# Patient Record
Sex: Female | Born: 1935 | Race: White | Hispanic: No | Smoking: Former smoker
Health system: Southern US, Community
[De-identification: ages and names within clinical notes are randomized; demographics above are authoritative.]

## PROBLEM LIST (undated history)

## (undated) DIAGNOSIS — I10 Essential (primary) hypertension: Secondary | ICD-10-CM

## (undated) DIAGNOSIS — E785 Hyperlipidemia, unspecified: Secondary | ICD-10-CM

## (undated) DIAGNOSIS — M353 Polymyalgia rheumatica: Secondary | ICD-10-CM

## (undated) DIAGNOSIS — C801 Malignant (primary) neoplasm, unspecified: Secondary | ICD-10-CM

## (undated) DIAGNOSIS — K219 Gastro-esophageal reflux disease without esophagitis: Secondary | ICD-10-CM

## (undated) DIAGNOSIS — M1990 Unspecified osteoarthritis, unspecified site: Secondary | ICD-10-CM

## (undated) DIAGNOSIS — M519 Unspecified thoracic, thoracolumbar and lumbosacral intervertebral disc disorder: Secondary | ICD-10-CM

## (undated) HISTORY — PX: ABDOMINAL HYSTERECTOMY: SHX81

---

## 2006-06-01 ENCOUNTER — Ambulatory Visit: Payer: Self-pay | Admitting: Unknown Physician Specialty

## 2008-06-22 ENCOUNTER — Inpatient Hospital Stay: Payer: Self-pay | Admitting: Internal Medicine

## 2008-07-11 ENCOUNTER — Ambulatory Visit: Payer: Self-pay | Admitting: Gastroenterology

## 2009-08-08 ENCOUNTER — Ambulatory Visit: Payer: Self-pay | Admitting: Family Medicine

## 2009-08-22 ENCOUNTER — Ambulatory Visit: Payer: Self-pay | Admitting: Family Medicine

## 2011-02-19 ENCOUNTER — Ambulatory Visit: Payer: Self-pay | Admitting: Family Medicine

## 2012-05-01 ENCOUNTER — Ambulatory Visit: Payer: Self-pay | Admitting: Family Medicine

## 2012-07-10 ENCOUNTER — Ambulatory Visit: Payer: Self-pay | Admitting: Gastroenterology

## 2012-07-21 ENCOUNTER — Ambulatory Visit: Payer: Self-pay | Admitting: Gastroenterology

## 2012-12-06 ENCOUNTER — Ambulatory Visit: Payer: Self-pay | Admitting: Family Medicine

## 2013-01-08 ENCOUNTER — Ambulatory Visit: Payer: Self-pay | Admitting: Family Medicine

## 2013-02-06 ENCOUNTER — Encounter: Payer: Self-pay | Admitting: Family Medicine

## 2013-02-10 ENCOUNTER — Encounter: Payer: Self-pay | Admitting: Family Medicine

## 2013-03-12 ENCOUNTER — Encounter: Payer: Self-pay | Admitting: Family Medicine

## 2014-01-07 ENCOUNTER — Ambulatory Visit: Payer: Self-pay | Admitting: Family Medicine

## 2014-06-26 DIAGNOSIS — R202 Paresthesia of skin: Secondary | ICD-10-CM | POA: Insufficient documentation

## 2014-06-26 DIAGNOSIS — R29898 Other symptoms and signs involving the musculoskeletal system: Secondary | ICD-10-CM | POA: Insufficient documentation

## 2014-06-26 DIAGNOSIS — M5431 Sciatica, right side: Secondary | ICD-10-CM | POA: Insufficient documentation

## 2014-06-26 DIAGNOSIS — R200 Anesthesia of skin: Secondary | ICD-10-CM | POA: Insufficient documentation

## 2014-08-28 DIAGNOSIS — I10 Essential (primary) hypertension: Secondary | ICD-10-CM | POA: Insufficient documentation

## 2014-08-28 DIAGNOSIS — E7800 Pure hypercholesterolemia, unspecified: Secondary | ICD-10-CM | POA: Insufficient documentation

## 2015-03-28 ENCOUNTER — Other Ambulatory Visit: Payer: Self-pay | Admitting: Gastroenterology

## 2015-03-28 DIAGNOSIS — R1031 Right lower quadrant pain: Secondary | ICD-10-CM

## 2015-03-28 DIAGNOSIS — R1032 Left lower quadrant pain: Secondary | ICD-10-CM

## 2015-04-08 ENCOUNTER — Ambulatory Visit
Admission: RE | Admit: 2015-04-08 | Discharge: 2015-04-08 | Disposition: A | Discharge status: Home / Self Care | Payer: 59 | Source: Ambulatory Visit | Attending: Gastroenterology | Admitting: Gastroenterology

## 2015-04-08 DIAGNOSIS — K5790 Diverticulosis of intestine, part unspecified, without perforation or abscess without bleeding: Secondary | ICD-10-CM | POA: Diagnosis not present

## 2015-04-08 DIAGNOSIS — R1032 Left lower quadrant pain: Secondary | ICD-10-CM | POA: Insufficient documentation

## 2015-04-08 DIAGNOSIS — R1031 Right lower quadrant pain: Secondary | ICD-10-CM | POA: Diagnosis present

## 2015-04-08 DIAGNOSIS — I700 Atherosclerosis of aorta: Secondary | ICD-10-CM | POA: Diagnosis not present

## 2015-04-08 HISTORY — DX: Essential (primary) hypertension: I10

## 2015-04-08 MED ORDER — IOHEXOL 300 MG/ML  SOLN
100.0000 mL | Freq: Once | INTRAMUSCULAR | Status: AC | PRN
  Administered 2015-04-08: 100 mL via INTRAVENOUS

## 2015-08-29 DIAGNOSIS — K219 Gastro-esophageal reflux disease without esophagitis: Secondary | ICD-10-CM | POA: Insufficient documentation

## 2016-03-03 DIAGNOSIS — Z0000 Encounter for general adult medical examination without abnormal findings: Secondary | ICD-10-CM | POA: Insufficient documentation

## 2016-11-16 IMAGING — CT CT ABD-PELV W/ CM
2 of 5 series · 16 of 46 positions shown, 18 images · IV contrast (omnipaque)
Comparison: None.

CLINICAL DATA: Abdominal pain and persistent diarrhea for 3 months.

EXAM:
CT ABDOMEN AND PELVIS WITH CONTRAST
TECHNIQUE: Multidetector CT imaging of the abdomen and pelvis was performed
using the standard protocol following bolus administration of
intravenous contrast.
CONTRAST:  100mL OMNIPAQUE IOHEXOL 300 MG/ML  SOLN

[Series 2: routine with · axial · 0.63mm/px · z∈[-859,-479]mm · 13 of 86 slices shown, 15 images]
[im 5/86  soft-tissue]
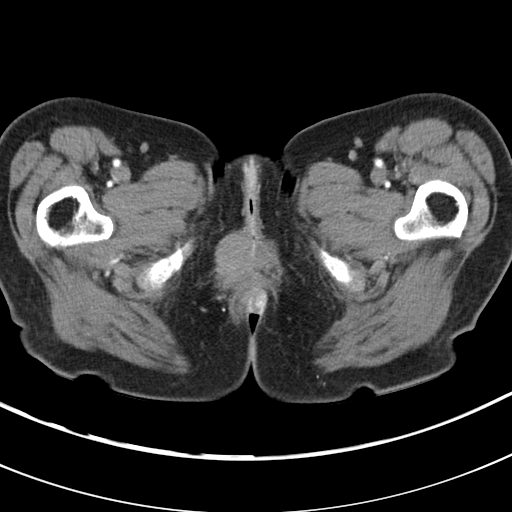
[im 5/86  bone]
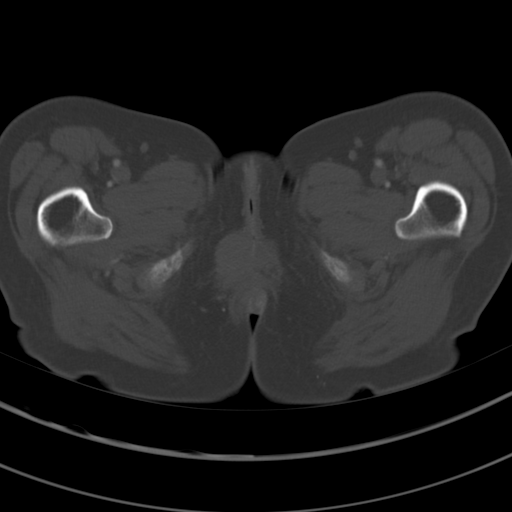
[im 10/86  soft-tissue]
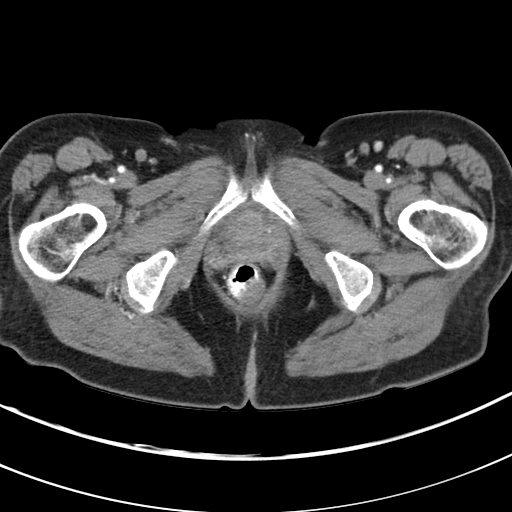
[im 19/86  soft-tissue]
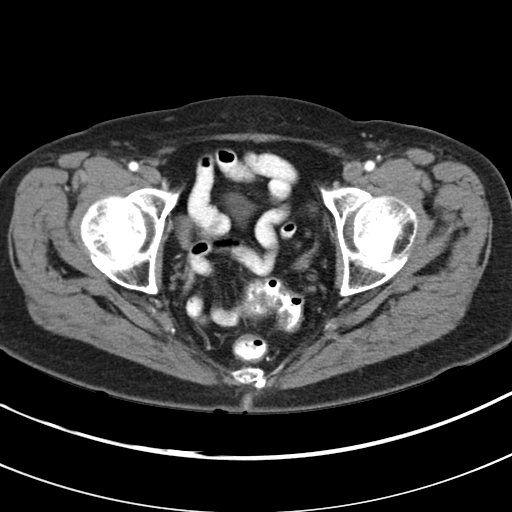
[im 24/86  soft-tissue]
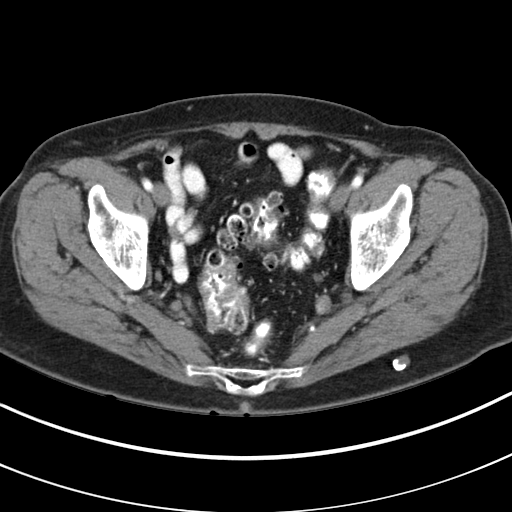
[im 29/86  soft-tissue]
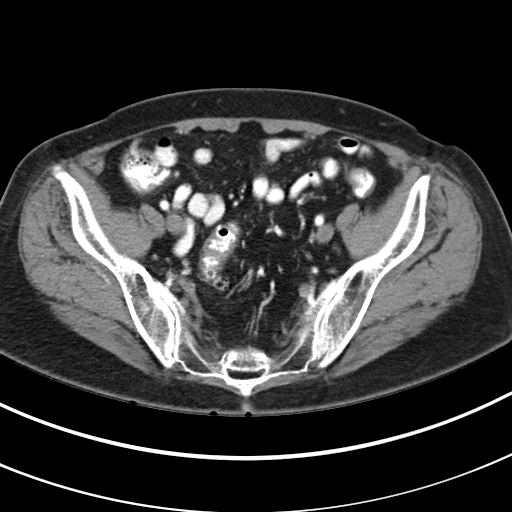
[im 38/86  soft-tissue]
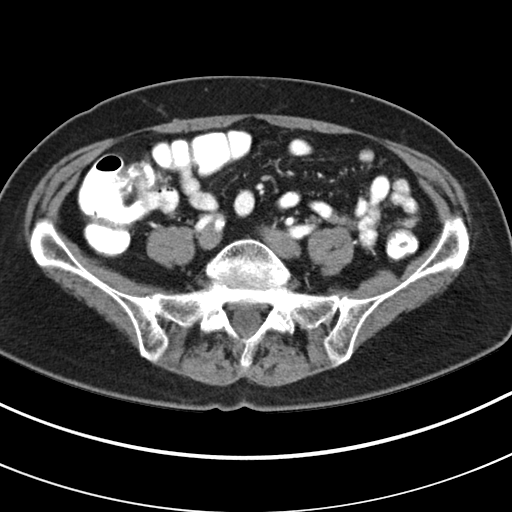
[im 43/86  soft-tissue]
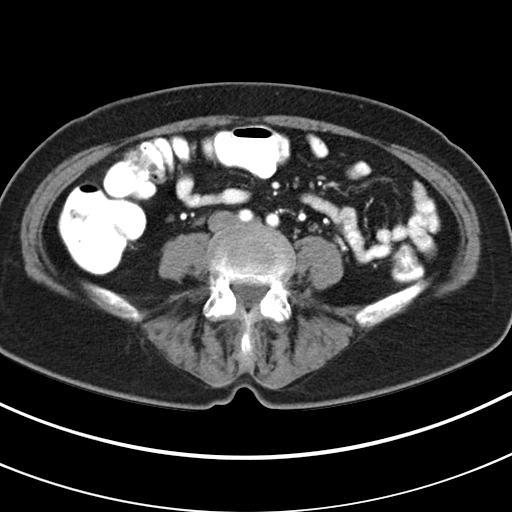
[im 48/86  soft-tissue]
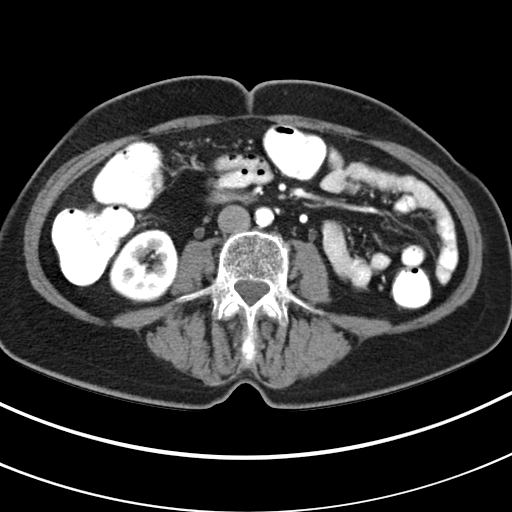
[im 57/86  soft-tissue]
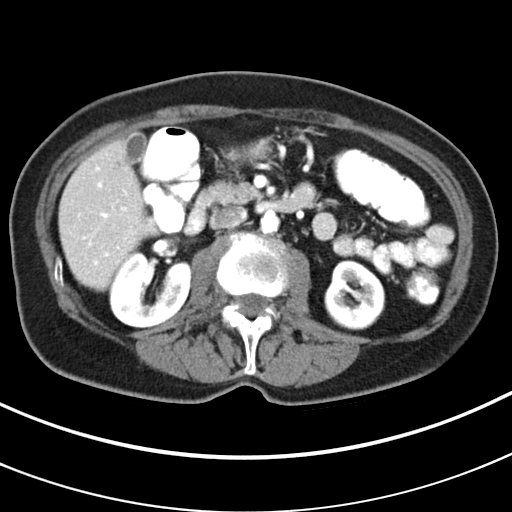
[im 57/86  bone]
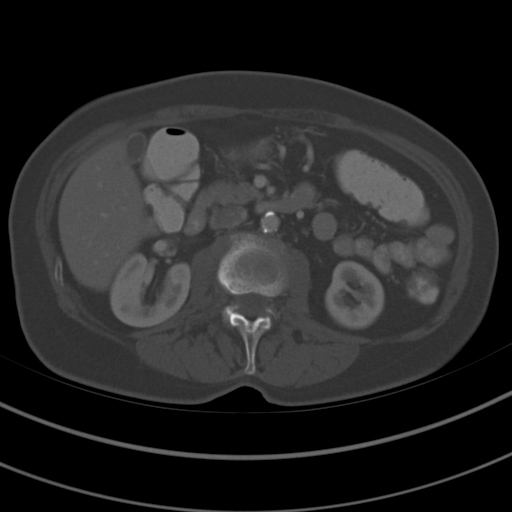
[im 62/86  soft-tissue]
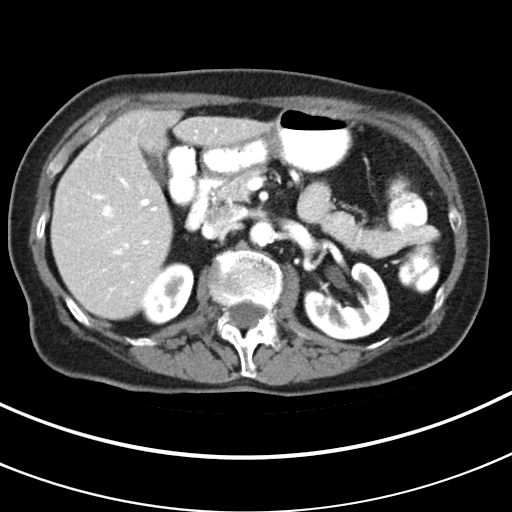
[im 67/86  soft-tissue]
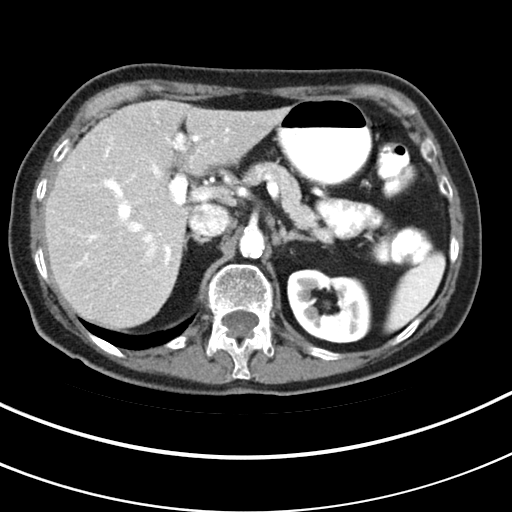
[im 76/86  soft-tissue]
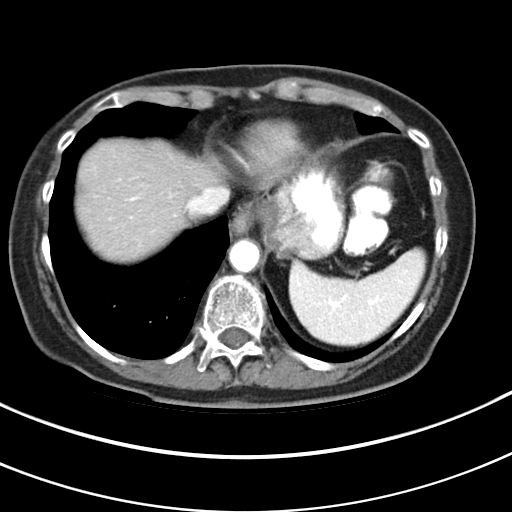
[im 81/86  soft-tissue]
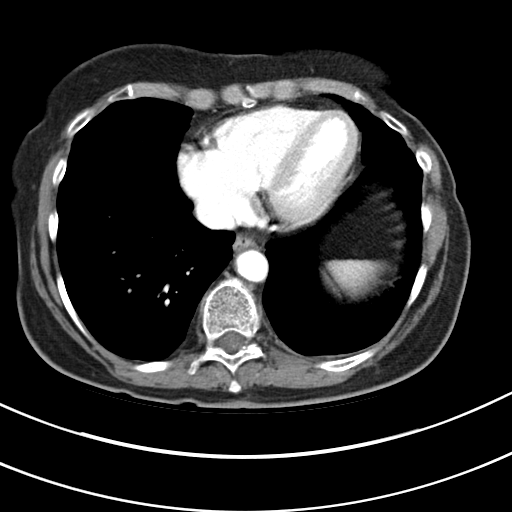

[Series 5: cor routine with · coronal · 0.68mm/px · 3 of 125 slices shown]
[im 42/125  soft-tissue]
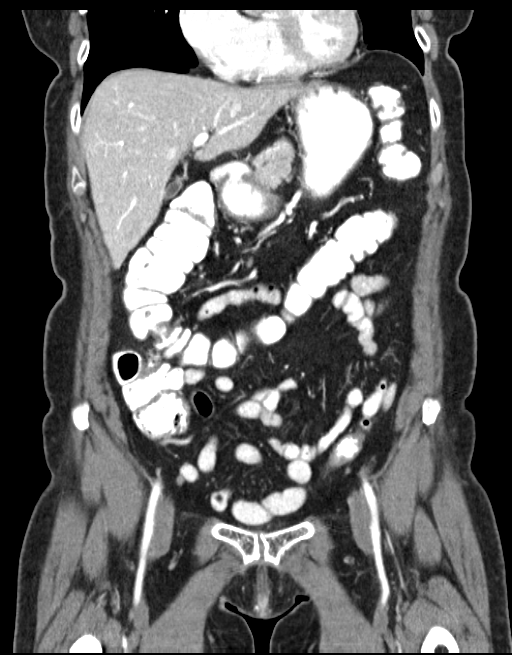
[im 56/125  soft-tissue]
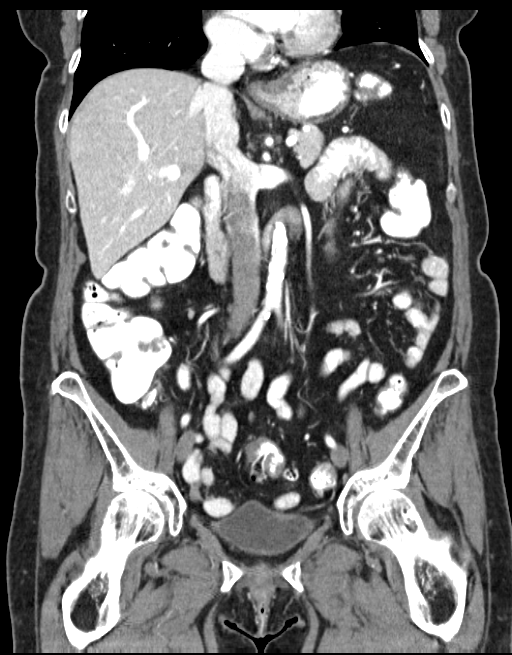
[im 69/125  soft-tissue]
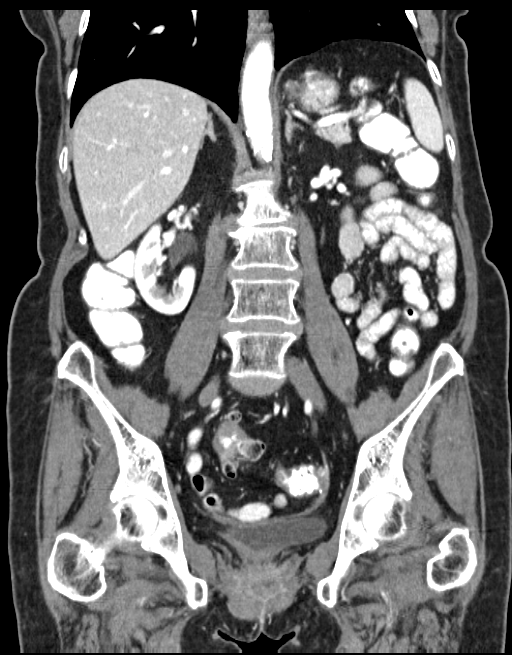

[16 of 46 positions shown; findings below may reference images not displayed]

FINDINGS: Lower chest:  Normal.

Hepatobiliary: Normal.

Pancreas: Normal.

Spleen: Normal.

Adrenals/Urinary Tract: Normal.

Stomach/Bowel: Numerous diverticula in the distal sigmoid colon. The
bowel is otherwise normal including the terminal ileum and appendix.

Vascular/Lymphatic: Extensive aortic atherosclerosis. No adenopathy.

Reproductive: Uterus has been removed.  Ovaries are normal.

Other: No free air or free fluid.

Musculoskeletal: Moderate degenerative disc disease at L2-3. No
acute osseous abnormality. Schmorl's nodes in the inferior and
superior endplates T11.
IMPRESSION: Diverticulosis of the distal colon. Aortic atherosclerosis. No acute
abnormalities.

## 2017-03-23 DIAGNOSIS — I700 Atherosclerosis of aorta: Secondary | ICD-10-CM | POA: Insufficient documentation

## 2017-08-16 ENCOUNTER — Other Ambulatory Visit: Payer: Self-pay | Admitting: Gastroenterology

## 2017-08-16 DIAGNOSIS — R1319 Other dysphagia: Secondary | ICD-10-CM

## 2017-08-16 DIAGNOSIS — R1310 Dysphagia, unspecified: Secondary | ICD-10-CM

## 2017-08-16 DIAGNOSIS — K529 Noninfective gastroenteritis and colitis, unspecified: Secondary | ICD-10-CM

## 2017-08-22 ENCOUNTER — Ambulatory Visit
Admission: RE | Admit: 2017-08-22 | Discharge: 2017-08-22 | Disposition: A | Discharge status: Home / Self Care | Payer: Medicare Other | Source: Ambulatory Visit | Attending: Gastroenterology | Admitting: Gastroenterology

## 2017-08-22 DIAGNOSIS — R1310 Dysphagia, unspecified: Secondary | ICD-10-CM | POA: Diagnosis present

## 2017-08-22 DIAGNOSIS — R1319 Other dysphagia: Secondary | ICD-10-CM

## 2017-08-22 DIAGNOSIS — K219 Gastro-esophageal reflux disease without esophagitis: Secondary | ICD-10-CM | POA: Diagnosis not present

## 2017-08-22 DIAGNOSIS — K529 Noninfective gastroenteritis and colitis, unspecified: Secondary | ICD-10-CM

## 2017-09-02 ENCOUNTER — Encounter: Payer: Self-pay | Admitting: *Deleted

## 2017-09-06 ENCOUNTER — Ambulatory Visit: Payer: Medicare Other | Admitting: Anesthesiology

## 2017-09-06 ENCOUNTER — Ambulatory Visit
Admission: RE | Admit: 2017-09-06 | Discharge: 2017-09-06 | Disposition: A | Discharge status: Home / Self Care | Payer: Medicare Other | Source: Ambulatory Visit | Attending: Gastroenterology | Admitting: Gastroenterology

## 2017-09-06 ENCOUNTER — Encounter: Payer: Self-pay | Admitting: *Deleted

## 2017-09-06 ENCOUNTER — Encounter
Admission: RE | Disposition: A | Discharge status: Home / Self Care | Payer: Self-pay | Source: Ambulatory Visit | Attending: Gastroenterology

## 2017-09-06 DIAGNOSIS — M353 Polymyalgia rheumatica: Secondary | ICD-10-CM | POA: Insufficient documentation

## 2017-09-06 DIAGNOSIS — E785 Hyperlipidemia, unspecified: Secondary | ICD-10-CM | POA: Diagnosis not present

## 2017-09-06 DIAGNOSIS — K529 Noninfective gastroenteritis and colitis, unspecified: Secondary | ICD-10-CM | POA: Diagnosis not present

## 2017-09-06 DIAGNOSIS — K219 Gastro-esophageal reflux disease without esophagitis: Secondary | ICD-10-CM | POA: Insufficient documentation

## 2017-09-06 DIAGNOSIS — R1310 Dysphagia, unspecified: Secondary | ICD-10-CM | POA: Diagnosis not present

## 2017-09-06 DIAGNOSIS — Q438 Other specified congenital malformations of intestine: Secondary | ICD-10-CM | POA: Diagnosis not present

## 2017-09-06 DIAGNOSIS — R194 Change in bowel habit: Secondary | ICD-10-CM | POA: Insufficient documentation

## 2017-09-06 DIAGNOSIS — Z7982 Long term (current) use of aspirin: Secondary | ICD-10-CM | POA: Diagnosis not present

## 2017-09-06 DIAGNOSIS — K5790 Diverticulosis of intestine, part unspecified, without perforation or abscess without bleeding: Secondary | ICD-10-CM | POA: Insufficient documentation

## 2017-09-06 DIAGNOSIS — I10 Essential (primary) hypertension: Secondary | ICD-10-CM | POA: Diagnosis not present

## 2017-09-06 DIAGNOSIS — R1013 Epigastric pain: Secondary | ICD-10-CM | POA: Insufficient documentation

## 2017-09-06 DIAGNOSIS — Z86010 Personal history of colonic polyps: Secondary | ICD-10-CM | POA: Diagnosis not present

## 2017-09-06 DIAGNOSIS — Z85828 Personal history of other malignant neoplasm of skin: Secondary | ICD-10-CM | POA: Diagnosis not present

## 2017-09-06 DIAGNOSIS — Z79899 Other long term (current) drug therapy: Secondary | ICD-10-CM | POA: Insufficient documentation

## 2017-09-06 DIAGNOSIS — K224 Dyskinesia of esophagus: Secondary | ICD-10-CM | POA: Diagnosis not present

## 2017-09-06 DIAGNOSIS — K449 Diaphragmatic hernia without obstruction or gangrene: Secondary | ICD-10-CM | POA: Insufficient documentation

## 2017-09-06 DIAGNOSIS — Z87891 Personal history of nicotine dependence: Secondary | ICD-10-CM | POA: Diagnosis not present

## 2017-09-06 DIAGNOSIS — K2950 Unspecified chronic gastritis without bleeding: Secondary | ICD-10-CM | POA: Diagnosis not present

## 2017-09-06 HISTORY — DX: Gastro-esophageal reflux disease without esophagitis: K21.9

## 2017-09-06 HISTORY — DX: Unspecified osteoarthritis, unspecified site: M19.90

## 2017-09-06 HISTORY — DX: Malignant (primary) neoplasm, unspecified: C80.1

## 2017-09-06 HISTORY — PX: COLONOSCOPY WITH PROPOFOL: SHX5780

## 2017-09-06 HISTORY — DX: Unspecified thoracic, thoracolumbar and lumbosacral intervertebral disc disorder: M51.9

## 2017-09-06 HISTORY — PX: ESOPHAGOGASTRODUODENOSCOPY (EGD) WITH PROPOFOL: SHX5813

## 2017-09-06 HISTORY — DX: Hyperlipidemia, unspecified: E78.5

## 2017-09-06 HISTORY — DX: Polymyalgia rheumatica: M35.3

## 2017-09-06 SURGERY — COLONOSCOPY WITH PROPOFOL
Anesthesia: General

## 2017-09-06 MED ORDER — PROPOFOL 10 MG/ML IV BOLUS
INTRAVENOUS | Status: AC
  Filled 2017-09-06: qty 40

## 2017-09-06 MED ORDER — FENTANYL CITRATE (PF) 100 MCG/2ML IJ SOLN
INTRAMUSCULAR | Status: AC
  Filled 2017-09-06: qty 2

## 2017-09-06 MED ORDER — SODIUM CHLORIDE 0.9 % IV SOLN
INTRAVENOUS | Status: DC
  Administered 2017-09-06: 1000 mL via INTRAVENOUS

## 2017-09-06 MED ORDER — PROPOFOL 500 MG/50ML IV EMUL
INTRAVENOUS | Status: DC | PRN
  Administered 2017-09-06: 125 ug/kg/min via INTRAVENOUS

## 2017-09-06 MED ORDER — PROPOFOL 10 MG/ML IV BOLUS
INTRAVENOUS | Status: DC | PRN
  Administered 2017-09-06: 80 mg via INTRAVENOUS

## 2017-09-06 MED ORDER — LIDOCAINE HCL (CARDIAC) PF 100 MG/5ML IV SOSY
PREFILLED_SYRINGE | INTRAVENOUS | Status: DC | PRN
  Administered 2017-09-06: 80 mg via INTRAVENOUS

## 2017-09-06 MED ORDER — FENTANYL CITRATE (PF) 100 MCG/2ML IJ SOLN
INTRAMUSCULAR | Status: DC | PRN
  Administered 2017-09-06: 50 ug via INTRAVENOUS
  Administered 2017-09-06 (×2): 25 ug via INTRAVENOUS

## 2017-09-06 MED ORDER — SODIUM CHLORIDE 0.9 % IV SOLN: INTRAVENOUS | Status: DC

## 2017-09-06 NOTE — Anesthesia Post-op Follow-up Note (Signed)
Anesthesia QCDR form completed.        

## 2017-09-06 NOTE — Transfer of Care (Signed)
Immediate Anesthesia Transfer of Care Note  Patient: Caroline Campbell  Procedure(s) Performed: COLONOSCOPY WITH PROPOFOL (N/A ) ESOPHAGOGASTRODUODENOSCOPY (EGD) WITH PROPOFOL (N/A )  Patient Location: PACU  Anesthesia Type:General  Level of Consciousness: sedated  Airway & Oxygen Therapy: Patient Spontanous Breathing  Post-op Assessment: Report given to RN and Post -op Vital signs reviewed and stable  Post vital signs: Reviewed and stable  Last Vitals:  Vitals Value Taken Time  BP    Temp    Pulse    Resp    SpO2      Last Pain: There were no vitals filed for this visit.       Complications: No apparent anesthesia complications

## 2017-09-06 NOTE — Op Note (Signed)
[facility] Patient Name: Caroline Campbell Procedure Date: 09/06/2017 12:19 PM MRN: [id number] Account #: [id number] Date of Birth: 07-07-1935 Admit Type: Outpatient Age: 82 Room: [facility] ENDO ROOM 2 Gender: Female Note Status: Finalized Procedure:            Upper GI endoscopy Indications:          Dyspepsia, Dysphagia, Gastro-esophageal reflux disease Providers:            Lollie Sails, MD Referring MD:         Ocie Cornfield. Ouida Sills MD, MD (Referring MD) Medicines:            Monitored Anesthesia Care Complications:        No immediate complications. Procedure:            Pre-Anesthesia Assessment:                       - ASA Grade Assessment: II - A patient with mild                        systemic disease.                       After obtaining informed consent, the endoscope was                        passed under direct vision. Throughout the procedure,                        the patient's blood pressure, pulse, and oxygen                        saturations were monitored continuously. The Endoscope                        was introduced through the mouth, and advanced to the                        third part of duodenum. The upper GI endoscopy was                        accomplished without difficulty. The patient tolerated                        the procedure well. Findings:      Abnormal motility was noted in the middle third of the esophagus and in       the lower third of the esophagus. The cricopharyngeus was normal. There       are extra peristaltic waves in the esophageal body. The distal       esophagus/lower esophageal sphincter is open. Tertiary peristaltic waves       are noted.      The Z-line was regular. Biopsies were taken with a cold forceps for       histology.      A small hiatal hernia was found. The Z-line was a variable distance from       incisors; the hiatal hernia was sliding.      Localized inflammation characterized  by congestion (edema) and erythema       was found in the gastric body and on the greater curvature of the  gastric body. Biopsies were taken with a cold forceps for histology.       Biopsies were taken with a cold forceps for Helicobacter pylori testing.      The cardia and gastric fundus were normal on retroflexion otherwise.      Diffuse mild mucosal variance characterized by smoothness and altered       texture was found in the entire duodenum. Biopsies were taken with a       cold forceps for histology. Impression:           - Abnormal esophageal motility, suspicious for                        presbyesophagus.                       - Z-line regular. Biopsied.                       - Small hiatal hernia. Recommendation:       - Continue present medications.                       - Return to GI clinic in 6 weeks.                       - Await pathology results.                       - Telephone GI clinic for pathology results in 1 week. Procedure Code(s):    --- Professional ---                       [phone], Esophagogastroduodenoscopy, flexible, transoral;                        with biopsy, single or multiple Diagnosis Code(s):    --- Professional ---                       K22.4, Dyskinesia of esophagus                       K44.9, Diaphragmatic hernia without obstruction or                        gangrene                       R10.13, Epigastric pain                       R13.10, Dysphagia, unspecified                       K21.9, Gastro-esophageal reflux disease without                        esophagitis CPT copyright 2017 American Medical Association. All rights reserved. The codes documented in this report are preliminary and upon coder review may  be revised to meet current compliance requirements. Lollie Sails, MD 09/06/2017 1:01:35 PM This report has been signed electronically. Number of Addenda: 0 Note Initiated On: 09/06/2017 12:19 PM      [facility]

## 2017-09-06 NOTE — Anesthesia Preprocedure Evaluation (Addendum)
Anesthesia Evaluation  Patient identified by MRN, date of birth, ID band Patient awake    Reviewed: Allergy & Precautions, NPO status , Patient's Chart, lab work & pertinent test results, reviewed documented beta blocker date and time   Airway Mallampati: II  TM Distance: >3 FB     Dental  (+) Upper Dentures, Lower Dentures   Pulmonary former smoker,           Cardiovascular hypertension, Pt. on medications and Pt. on home beta blockers      Neuro/Psych    GI/Hepatic GERD  ,  Endo/Other    Renal/GU      Musculoskeletal  (+) Arthritis ,   Abdominal   Peds  Hematology   Anesthesia Other Findings   Reproductive/Obstetrics                            Anesthesia Physical Anesthesia Plan  ASA: III  Anesthesia Plan: General   Post-op Pain Management:    Induction: Intravenous  PONV Risk Score and Plan:   Airway Management Planned:   Additional Equipment:   Intra-op Plan:   Post-operative Plan:   Informed Consent: I have reviewed the patients History and Physical, chart, labs and discussed the procedure including the risks, benefits and alternatives for the proposed anesthesia with the patient or authorized representative who has indicated his/her understanding and acceptance.     Plan Discussed with: CRNA  Anesthesia Plan Comments:         Anesthesia Quick Evaluation

## 2017-09-06 NOTE — Anesthesia Postprocedure Evaluation (Signed)
Anesthesia Post Note  Patient: Caroline Campbell  Procedure(s) Performed: COLONOSCOPY WITH PROPOFOL (N/A ) ESOPHAGOGASTRODUODENOSCOPY (EGD) WITH PROPOFOL (N/A )  Patient location during evaluation: Endoscopy Anesthesia Type: General Level of consciousness: awake and alert Pain management: pain level controlled Vital Signs Assessment: post-procedure vital signs reviewed and stable Respiratory status: spontaneous breathing, nonlabored ventilation, respiratory function stable and patient connected to nasal cannula oxygen Cardiovascular status: blood pressure returned to baseline and stable Postop Assessment: no apparent nausea or vomiting Anesthetic complications: no     Last Vitals:  Vitals:   09/06/17 1401 09/06/17 1403  BP: (!) 141/86 (!) 141/86  Pulse: 78 71  Resp: 14 17  Temp:    SpO2: 98% 100%    Last Pain:  Vitals:   09/06/17 1403  PainSc: 0-No pain                 Natoya Viscomi S

## 2017-09-06 NOTE — Op Note (Signed)
[facility]astroenter[facility] Patient Name: Caroline Campbell Procedure Date: 09/06/2017 12:19 PM MRN: [id number] Account #: [id number] Date of Birth: December 11, 1935 Admit Type: Outpatient Age: 82 Room: [facility] ENDO ROOM 2 Gender: Female Note Status: Finalized Procedure:            Colonoscopy Indications:          Chronic diarrhea, Change in bowel habits Providers:            Lollie Sails, MD Referring MD:         Ocie Cornfield. Ouida Sills MD, MD (Referring MD) Medicines:            Monitored Anesthesia Care Complications:        Mild scope irritation in the rectum with some mucosal                        friability. Procedure:            Pre-Anesthesia Assessment:                       - ASA Grade Assessment: II - A patient with mild                        systemic disease.                       After obtaining informed consent, the colonoscope was                        passed under direct vision. Throughout the procedure,                        the patient's blood pressure, pulse, and oxygen                        saturations were monitored continuously. The                        Colonoscope was introduced through the anus and                        advanced to the the cecum, identified by appendiceal                        orifice and ileocecal valve. The colonoscopy was                        unusually difficult due to multiple diverticula in the                        colon, a redundant colon and a tortuous colon.                        Successful completion of the procedure was aided by                        changing the patient to a supine position, changing the                        patient to a prone position and using manual pressure.  The patient tolerated the procedure well. The quality                        of the bowel preparation was good. Findings:      The sigmoid colon, descending colon and transverse colon were   significantly tortuous.      Many small and large-mouthed diverticula were found in the sigmoid colon       and descending colon.      Biopsies for histology were taken with a cold forceps from the right       colon and left colon for evaluation of microscopic colitis.      The exam was otherwise normal throughout the examined colon.      The digital rectal exam was normal. Impression:           - Tortuous colon.                       - Diverticulosis in the sigmoid colon and in the                        descending colon.                       - Biopsies were taken with a cold forceps from the                        right colon and left colon for evaluation of                        microscopic colitis. Recommendation:       - Discharge patient to home.                       - Soft diet today, then advance as tolerated to advance                        diet as tolerated.                       - Await pathology results.                       - Return to GI clinic in 3 weeks. Procedure Code(s):    --- Professional ---                       [phone], Colonoscopy, flexible; with biopsy, single or                        multiple Diagnosis Code(s):    --- Professional ---                       K52.9, Noninfective gastroenteritis and colitis,                        unspecified                       R19.4, Change in bowel habit                       K57.30, Diverticulosis of large intestine without  perforation or abscess without bleeding                       Q43.8, Other specified congenital malformations of                        intestine CPT copyright 2017 American Medical Association. All rights reserved. The codes documented in this report are preliminary and upon coder review may  be revised to meet current compliance requirements. Lollie Sails, MD 09/06/2017 1:41:37 PM This report has been signed electronically. Number of Addenda: 0 Note Initiated On:  09/06/2017 12:19 PM Scope Withdrawal Time: 0 hours 11 minutes 40 seconds  Total Procedure Duration: 0 hours 31 minutes 55 seconds       [facility]

## 2017-09-06 NOTE — H&P (Signed)
Outpatient short stay form Pre-procedure 09/06/2017 12:29 PM Caroline Sails MD  Primary Physician: Frazier Richards MD  Reason for visit: EGD and colonoscopy  History of present illness: It is a 82 year old female presenting today as above.  She has complained of some dyspepsia and some dysphagia.  She has a normal barium swallow.  She does however take powdered aspirin product daily up to several times daily.  This is for musculoskeletal pain.  She has been on a proton pump inhibitor but this is been increased to twice a day.  He also has history of some lower abdominal discomfort and diarrhea.  Does take some Imodium.  Is also a personal history of adenomatous colon polyps.  Last colonoscopy was in 2010 with no polyps at that time.  He tolerated her prep well.  She has stopped her Gabriel Earing powders for the past 5 to 7 days.  No other aspirin products or blood thinning agent.    Current Facility-Administered Medications:  .  0.9 %  sodium chloride infusion, , Intravenous, Continuous, Caroline Sails, MD .  0.9 %  sodium chloride infusion, , Intravenous, Continuous, Caroline Sails, MD, Last Rate: 20 mL/hr at 09/06/17 1209, 1,000 mL at 09/06/17 1209  Medications Prior to Admission  Medication Sig Dispense Refill Last Dose  . aspirin-acetaminophen-caffeine (EXCEDRIN MIGRAINE) 250-250-65 MG tablet Take by mouth every 6 (six) hours as needed for headache.   Past Month at Unknown time  . atorvastatin (LIPITOR) 20 MG tablet Take 20 mg by mouth daily.   09/05/2017 at Unknown time  . Coenzyme Q10 10 MG capsule Take 10 mg by mouth daily.   Past Week at Unknown time  . gabapentin (NEURONTIN) 100 MG capsule Take 100 mg by mouth 3 (three) times daily.   09/05/2017 at Unknown time  . metoprolol succinate (TOPROL-XL) 50 MG 24 hr tablet Take 50 mg by mouth daily. Take with or immediately following a meal.   09/05/2017 at Unknown time  . OMEGA-3 FATTY ACIDS PO Take 1,000 mg by mouth 2 (two) times daily.    Past Week at Unknown time  . pantoprazole (PROTONIX) 40 MG tablet Take 40 mg by mouth daily.   09/05/2017 at Unknown time  . Vitamin E Acetate 1 UNIT/MG LIQD by Does not apply route daily.   Past Week at Unknown time     Allergies  Allergen Reactions  . Panmycin [Tetracycline]     She states it caused her to have blisters inside her mouth.   Alveta Heimlich [Rosuvastatin Calcium]     Muscle pain      Past Medical History:  Diagnosis Date  . Arthritis   . Cancer ([place])    skin  . GERD (gastroesophageal reflux disease)   . Hyperlipidemia   . Hypertension   . Lumbar disc disease   . PMR (polymyalgia rheumatica) ([facility])     Review of systems:      Physical Exam    Heart and lungs: Regular rate and rhythm without rub or gallop, lungs are bilaterally clear.    HEENT: Normocephalic atraumatic eyes are anicteric    Other:    Pertinant exam for procedure: Soft nontender nondistended bowel sounds positive normoactive.    Planned proceedures: EGD, colonoscopy and indicated procedures. I have discussed the risks benefits and complications of procedures to include not limited to bleeding, infection, perforation and the risk of sedation and the patient wishes to proceed.    Caroline Sails, MD Gastroenterology 09/06/2017  12:29  PM

## 2017-09-07 ENCOUNTER — Encounter: Payer: Self-pay | Admitting: Gastroenterology

## 2017-09-09 LAB — SURGICAL PATHOLOGY

## 2017-09-22 DIAGNOSIS — K589 Irritable bowel syndrome without diarrhea: Secondary | ICD-10-CM | POA: Insufficient documentation

## 2018-02-21 ENCOUNTER — Other Ambulatory Visit: Payer: Self-pay | Admitting: Physical Medicine and Rehabilitation

## 2018-02-21 DIAGNOSIS — M8929 Other disorders of bone development and growth, multiple sites: Secondary | ICD-10-CM

## 2018-02-21 DIAGNOSIS — M5442 Lumbago with sciatica, left side: Secondary | ICD-10-CM

## 2018-03-13 ENCOUNTER — Ambulatory Visit
Admission: RE | Admit: 2018-03-13 | Discharge: 2018-03-13 | Disposition: A | Discharge status: Home / Self Care | Payer: Medicare Other | Source: Ambulatory Visit | Attending: Physical Medicine and Rehabilitation | Admitting: Physical Medicine and Rehabilitation

## 2018-03-13 DIAGNOSIS — M5442 Lumbago with sciatica, left side: Secondary | ICD-10-CM | POA: Diagnosis present

## 2018-03-13 DIAGNOSIS — M48061 Spinal stenosis, lumbar region without neurogenic claudication: Secondary | ICD-10-CM | POA: Insufficient documentation

## 2018-03-13 DIAGNOSIS — M5126 Other intervertebral disc displacement, lumbar region: Secondary | ICD-10-CM | POA: Insufficient documentation

## 2018-03-13 DIAGNOSIS — M5136 Other intervertebral disc degeneration, lumbar region: Secondary | ICD-10-CM | POA: Insufficient documentation

## 2018-03-13 DIAGNOSIS — M8929 Other disorders of bone development and growth, multiple sites: Secondary | ICD-10-CM

## 2018-03-13 DIAGNOSIS — G8929 Other chronic pain: Secondary | ICD-10-CM | POA: Diagnosis not present

## 2018-03-13 DIAGNOSIS — M5441 Lumbago with sciatica, right side: Secondary | ICD-10-CM | POA: Diagnosis present

## 2019-03-21 ENCOUNTER — Other Ambulatory Visit: Payer: Self-pay

## 2019-03-21 DIAGNOSIS — Z20822 Contact with and (suspected) exposure to covid-19: Secondary | ICD-10-CM

## 2019-03-23 LAB — NOVEL CORONAVIRUS, NAA: SARS-CoV-2, NAA: DETECTED — AB

## 2021-04-13 NOTE — Progress Notes (Signed)
Patient: Caroline Campbell  Service Category: E/M  Provider: Gaspar Cola, MD  DOB: 23-Jul-1935  DOS: 04/15/2021  Referring Provider: Harvest Dark, FNP  MRN: [id number]  Setting: Ambulatory outpatient  PCP: Kirk Ruths, MD  Type: New Patient  Specialty: Interventional Pain Management    Location: Office  Delivery: Face-to-face     Primary Reason(s) for Visit: Encounter for initial evaluation of one or more chronic problems (new to examiner) potentially causing chronic pain, and posing a threat to normal musculoskeletal function. (Level of risk: High) CC: Back Pain (lower)  HPI  Caroline Campbell is a 86 y.o. year old, female patient, who comes for the first time to our practice referred by Meeler, Sherren Kerns, FNP for our initial evaluation of her chronic pain. She has Aortic atherosclerosis ([place]); Chronic sciatica, right; Essential hypertension with goal blood pressure less than 140/90; GERD without esophagitis; Hand weakness; Health care maintenance; Irritable bowel; Paresthesia of both hands; Pure hypercholesterolemia; Right leg numbness; Chronic pain syndrome; Pharmacologic therapy; Disorder of skeletal system; Problems influencing health status; DDD (degenerative disc disease), lumbar; Osteoarthritis of spine with radiculopathy, lumbar region; Abnormal MRI, lumbar spine (03/13/2018); Lumbosacral facet arthropathy; Grade 1 Anterolisthesis of lumbosacral spine (L5/S1); 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 Today she comes in for evaluation of her Back Pain (lower)  Pain Assessment: Location:   Back (neck) Radiating: pain  radiaties down right hip with some heaviness in leg Onset: More than a month ago Duration: Chronic pain Quality: Aching, Dull, Constant Severity: 8 /10 (subjective, self-reported pain score)  Effect on ADL: limits my daily activities Timing: Constant Modifying factors: heat, pain patches and laying down BP: (!) 181/80   HR: 63  Onset and Duration: Sudden and Present longer than 3 months Cause of pain: Unknown Severity: Getting worse, NAS-11 at its worse: 10/10, NAS-11 at its best: 3/10, NAS-11 now: 8/10, and NAS-11 on the average: 6/10 Timing: Morning and Afternoon Aggravating Factors: Prolonged sitting and Prolonged standing Alleviating Factors: Hot packs Associated Problems: Fatigue and Pain that wakes patient up Quality of Pain: Aching, Deep, Distressing, and Throbbing Previous Examinations or Tests: MRI scan Previous Treatments: Narcotic medications  According to the patient the primary area of pain is that of the lower back (Bilateral) (R>L).  She denies any prior surgeries, physical therapy, nerve blocks, and she indicates having had an MRI done 2 to 3 years ago.  The patient is secondary area pain is that of the neck (posterior aspect) (Bilateral) (R>L).  She describes this pain to be intermittent contrary to the low back pain which is constant.  She denies any prior surgeries, physical therapy, recent x-rays, or nerve blocks.  The patient is third area pain is that of the shoulder (Bilateral) (R>L).  This pain is also described to be intermittent.  No prior surgeries, physical therapy, recent x-rays, but she does indicate having had 1 prior shoulder injections done at the [facility] which not only did not help, but apparently aggravated her pain and she about not to have any more done.  The patient's fourth area pain is that of the upper back between the shoulder blades.  She describes this pain as being  in the midline and bilateral.  It is also intermittent.  She denies any  prior surgeries, physical therapy, or nerve blocks.  Pharmacotherapy: According to the patient she has been managing her pain with hydrocodone/APAP 10/325 tablets, 1 tab p.o. twice daily as needed for pain.  She describes having use this form of therapy for a long time without any problems until recently when she was tested at the [facility] and found to have a urine drug screening test negative for her pain medication.  At that point, she was discharged from the service.  As an explanation for this, the patient indicates that they are very last prescription was obtained at the [facility] in [facility], which closed about a week before she had her prescription filled.  When she obtained that prescription she commented to her children that there were something wrong with the medicine since it did not seem to control her pain.  She refers that she honestly thinks that the prescription was switched to something else and this is the reason why it was not working and it did not trigger the urine drug screen test.  Today we requested a copy of the drug screening test to determine if it was an immunoassay test that have been done, but according to [facility], it is exactly the same type of test that we normally order.  However, no medication list was supplied with the test and the test simply reads "no medications found".  We found this to be rather odd since our test will normally document those medications that the patient is taking including those that are not controlled substances.  According to the patient's PMP she has been using her medication regularly and has not been "doctor shopping".  In addition, the medication reconciliation shows that the patient takes several medications including aspirin, Excedrin migraine, Lipitor, Neurontin, Toprol-XL, Protonix, cyanocobalamin, none of which were mentioned in her urine drug screen test.  The only thing that the test reported was the patient's creatinine at 78.  Today the  patient was asked if she still had some of the medication left and she indicated that she does.  We have requested that she bring it in to be analyzed.  The patient indicates that she has not taking any of the hydrocodone since Thanksgiving when they stopped prescribing it for her.  Since the patient had indicated that she was not interested in any type of interventional therapy, and I am currently not taking any patients for medication management secondary to lack of manpower, I have offered the patient to complete her evaluation and to present the case to Dr. Gillis Santa for him to evaluate and decide whether or not he wants to take on the case.  The patient has been made aware today that when she returns she will be seen Dr. Holley Raring and not me.  She indicated being okay with this.  Historic Controlled Substance Pharmacotherapy Review  PMP and historical list of controlled substances: Hydrocodone/APAP 5/325 tablets, 1 tab p.o. twice daily (10 mg/day of hydrocodone) (last prescription filled on 12/09/2020). Current opioid analgesics:   None MME/day: 0 mg/day  Historical Monitoring: The patient  reports no history of drug use. List of all UDS Test(s): No results found for: MDMA, COCAINSCRNUR, [place], Maverick, CANNABQUANT, THCU, Butts List of other Serum/Urine Drug Screening Test(s):  No results found for: AMPHSCRSER, BARBSCRSER, BENZOSCRSER, COCAINSCRSER, COCAINSCRNUR, PCPSCRSER, PCPQUANT, THCSCRSER, THCU, CANNABQUANT, OPIATESCRSER, OXYSCRSER, PROPOXSCRSER, ETH Historical Background Evaluation: [place] PMP: PDMP reviewed during this encounter.  Online review of the past 40-monthperiod conducted.             PMP NARX Score Report:  Narcotic: 230 Sedative: 100 Stimulant: 000  Department of public safety, offender search: (Editor, commissioningInformation) Non-contributory Risk Assessment Profile: Aberrant behavior: None observed or detected today Risk factors for fatal opioid overdose: None identified  today PMP NARX Overdose Risk Score: 240 Fatal overdose hazard ratio (HR): Calculation deferred Non-fatal overdose hazard ratio (HR): Calculation deferred Risk of opioid abuse or dependence: 0.7-3.0% with doses ? 36 MME/day and 6.1-26% with doses ? 120 MME/day. Substance use disorder (SUD) risk level: See below Personal History of Substance Abuse (SUD-Substance use disorder):  Alcohol: Negative  Illegal Drugs: Negative  Rx Drugs: Negative  ORT Risk Level calculation: Low Risk  Opioid Risk Tool - 04/15/21 0942       Family History of Substance Abuse   Alcohol Negative    Illegal Drugs Positive Female    Rx Drugs Negative      Personal History of Substance Abuse   Alcohol Negative    Illegal Drugs Negative    Rx Drugs Negative      Age   Age between 145-45years  No      History of Preadolescent Sexual Abuse   History of Preadolescent Sexual Abuse Negative or Female      Psychological Disease   Psychological Disease Negative    Depression Negative      Total Score   Opioid Risk Tool Scoring 3    Opioid Risk Interpretation Low Risk            ORT Scoring interpretation table:  Score <3 = Low Risk for SUD  Score between 4-7 = Moderate Risk for SUD  Score >8 = High Risk for Opioid Abuse   PHQ-2 Depression Scale:  Total score:    PHQ-2 Scoring interpretation table: (Score and probability of major depressive disorder)  Score 0 = No depression  Score 1 = 15.4% Probability  Score 2 = 21.1% Probability  Score 3 = 38.4% Probability  Score 4 = 45.5% Probability  Score 5 = 56.4% Probability  Score 6 = 78.6% Probability   PHQ-9 Depression Scale:  Total score:    PHQ-9 Scoring interpretation table:  Score 0-4 = No depression  Score 5-9 = Mild depression  Score 10-14 = Moderate depression  Score 15-19 = Moderately severe depression  Score 20-27 = Severe depression (2.4 times higher risk of SUD and 2.89 times higher risk of overuse)   Pharmacologic Plan: As per protocol,  I have not taken over any controlled substance management, pending the results of ordered tests and/or consults.            Initial impression: Pending review of available data and ordered tests.  Meds   Current Outpatient Medications:    Aspirin-Acetaminophen (GOODYS BODY PAIN PO), Take by mouth 3 (three) times daily., Disp: , Rfl:    atorvastatin (LIPITOR) 20 MG tablet, Take 20 mg by mouth daily., Disp: , Rfl:    Coenzyme Q10 10 MG capsule, Take 10 mg by mouth daily., Disp: , Rfl:    gabapentin (NEURONTIN) 100 MG capsule, Take 100 mg by mouth 3 (three) times daily., Disp: , Rfl:    glucosamine-chondroitin 500-400 MG tablet, Take 1 tablet by mouth 3 (three) times daily., Disp: , Rfl:    Lidocaine (ASPERCREME LIDOCAINE) 4 % PTCH, Apply topically once., Disp: , Rfl:    metoprolol succinate (TOPROL-XL) 50 MG 24  hr tablet, Take 50 mg by mouth daily. Take with or immediately following a meal., Disp: , Rfl:    OMEGA-3 FATTY ACIDS PO, Take 1,000 mg by mouth 2 (two) times daily., Disp: , Rfl:    pantoprazole (PROTONIX) 40 MG tablet, Take 40 mg by mouth daily., Disp: , Rfl:    vitamin B-12 (CYANOCOBALAMIN) 100 MCG tablet, Take 100 mcg by mouth daily., Disp: , Rfl:    Vitamin E Acetate 1 UNIT/MG LIQD, by Does not apply route daily., Disp: , Rfl:   Imaging Review  Lumbosacral Imaging: Lumbar MR wo contrast: Results for orders placed during the hospital encounter of 03/13/18 MR LUMBAR SPINE WO CONTRAST  Narrative CLINICAL DATA:  Chronic low back pain with numbness and tingling in the right leg.  EXAM: MRI LUMBAR SPINE WITHOUT CONTRAST  TECHNIQUE: Multiplanar, multisequence MR imaging of the lumbar spine was performed. No intravenous contrast was administered.  COMPARISON:  CT scan 04/08/2015  FINDINGS: Segmentation: There are five lumbar type vertebral bodies. The last full intervertebral disc space is labeled L5-S1.  Alignment:  Normal  Vertebrae:  No bone lesions or  fracture.  Conus medullaris and cauda equina: Conus extends to the T12-L1 level. Conus and cauda equina appear normal.  Paraspinal and other soft tissues: No significant findings.  Disc levels:  T12-L1: No significant findings.  L1-2: No significant findings.  L2-3: Moderate degenerate disc disease with a bulging annulus and mainly right-sided osteophytic ridging. There is bilateral lateral recess stenosis, right greater than left and mild right foraminal encroachment but no direct neural compression.  L3-4: No significant findings.  L4-5: Moderate facet disease and mild bulging annulus but no significant spinal, lateral recess or foraminal stenosis.  L5-S1: Mild degenerative anterolisthesis of L5. The spinal canal is generous. No spinal or foraminal stenosis. Moderate facet disease.  IMPRESSION: 1. Moderate degenerate disc disease at L2-3 with a bulging annulus and mainly right-sided osteophytic ridging. Mild bilateral lateral recess stenosis, right greater than left along with mild right foraminal encroachment. 2. No other significant findings.   Electronically Signed By: Marijo Sanes M.D. On: 03/13/2018 15:24  Complexity Note: Imaging results reviewed. Results shared with Ms. Smola, using [employer].                        ROS  Cardiovascular: High blood pressure Pulmonary or Respiratory: No reported pulmonary signs or symptoms such as wheezing and difficulty taking a deep full breath (Asthma), difficulty blowing air out (Emphysema), coughing up mucus (Bronchitis), persistent dry cough, or temporary stoppage of breathing during sleep Neurological: No reported neurological signs or symptoms such as seizures, abnormal skin sensations, urinary and/or fecal incontinence, being born with an abnormal open spine and/or a tethered spinal cord Psychological-Psychiatric: Anxiousness Gastrointestinal: Reflux or heatburn and Alternating episodes iof diarrhea and  constipation (IBS-Irritable bowe syndrome) Genitourinary: No reported renal or genitourinary signs or symptoms such as difficulty voiding or producing urine, peeing blood, non-functioning kidney, kidney stones, difficulty emptying the bladder, difficulty controlling the flow of urine, or chronic kidney disease Hematological: Brusing easily Endocrine: No reported endocrine signs or symptoms such as high or low blood sugar, rapid heart rate due to high thyroid levels, obesity or weight gain due to slow thyroid or thyroid disease Rheumatologic: Generalized muscle aches (Fibromyalgia) Musculoskeletal: Negative for myasthenia gravis, muscular dystrophy, multiple sclerosis or malignant hyperthermia Work History: Retired  Allergies  Ms. Tumminello is allergic to panmycin [tetracycline] and crestor [rosuvastatin calcium].  Laboratory Chemistry  Profile   Renal No results found for: BUN, CREATININE, LABCREA, BCR, GFR, GFRAA, GFRNONAA, SPECGRAV, PHUR, PROTEINUR   Electrolytes No results found for: NA, K, CL, CALCIUM, MG, PHOS   Hepatic No results found for: AST, ALT, ALBUMIN, ALKPHOS, AMYLASE, LIPASE, AMMONIA   ID Lab Results  Component Value Date   SARSCOV2NAA Detected (A) 03/21/2019     Bone No results found for: [place], [id number], [id number], [id number], 25OHVITD1, 25OHVITD2, 25OHVITD3, TESTOFREE, TESTOSTERONE   Endocrine No results found for: GLUCOSE, GLUCOSEU, HGBA1C, TSH, FREET4, TESTOFREE, TESTOSTERONE, SHBG, ESTRADIOL, ESTRADIOLPCT, ESTRADIOLFRE, LABPREG, ACTH, CRTSLPL, UCORFRPERLTR, UCORFRPERDAY, CORTISOLBASE, LABPREG   Neuropathy No results found for: VITAMINB12, FOLATE, HGBA1C, HIV   CNS No results found for: COLORCSF, APPEARCSF, RBCCOUNTCSF, WBCCSF, POLYSCSF, LYMPHSCSF, EOSCSF, PROTEINCSF, GLUCCSF, JCVIRUS, CSFOLI, IGGCSF, LABACHR, ACETBL, LABACHR, ACETBL   Inflammation (CRP: Acute   ESR: Chronic) No results found for: CRP, ESRSEDRATE, LATICACIDVEN   Rheumatology No results found  for: RF, ANA, LABURIC, URICUR, LYMEIGGIGMAB, LYMEABIGMQN, HLAB27   Coagulation No results found for: INR, LABPROT, APTT, PLT, DDIMER, LABHEMA, VITAMINK1, AT3   Cardiovascular No results found for: BNP, CKTOTAL, CKMB, TROPONINI, HGB, HCT, LABVMA, EPIRU, [id number], NOREPRU, NOREPI24HUR, DOPARU, [id number]   Screening Lab Results  Component Value Date   SARSCOV2NAA Detected (A) 03/21/2019     Cancer No results found for: CEA, CA125, LABCA2   Allergens No results found for: ALMOND, APPLE, ASPARAGUS, AVOCADO, BANANA, BARLEY, BASIL, BAYLEAF, GREENBEAN, LIMABEAN, WHITEBEAN, BEEFIGE, REDBEET, BLUEBERRY, BROCCOLI, CABBAGE, MELON, CARROT, CASEIN, CASHEWNUT, CAULIFLOWER, CELERY     Note: Lab results reviewed.  PFSH  Drug: Ms. Illingworth  reports no history of drug use. Alcohol:  reports current alcohol use. Tobacco:  reports that she has quit smoking. She has never used smokeless tobacco. Medical:  has a past medical history of Arthritis, Cancer ([place]), GERD (gastroesophageal reflux disease), Hyperlipidemia, Hypertension, Lumbar disc disease, and PMR (polymyalgia rheumatica) ([place]). Family: family history is not on file.  Past Surgical History:  Procedure Laterality Date   ABDOMINAL HYSTERECTOMY     COLONOSCOPY WITH PROPOFOL N/A 09/06/2017   Procedure: COLONOSCOPY WITH PROPOFOL;  Surgeon: Lollie Sails, MD;  Location[facility] ENDOSCOPY;  Service: Endoscopy;  Laterality: N/A;   ESOPHAGOGASTRODUODENOSCOPY (EGD) WITH PROPOFOL N/A 09/06/2017   Procedure: ESOPHAGOGASTRODUODENOSCOPY (EGD) WITH PROPOFOL;  Surgeon: Lollie Sails, MD;  Location: [facility] ENDOSCOPY;  Service: Endoscopy;  Laterality: N/A;   Active Ambulatory Problems    Diagnosis Date Noted   Aortic atherosclerosis ([place]) 03/23/2017   Chronic sciatica, right 06/26/2014   Essential hypertension with goal blood pressure less than 140/90 08/28/2014   GERD without esophagitis 08/29/2015   Hand weakness 06/26/2014   Health care  maintenance 03/03/2016   Irritable bowel 09/22/2017   Paresthesia of both hands 06/26/2014   Pure hypercholesterolemia 08/28/2014   Right leg numbness 06/26/2014   Chronic pain syndrome 04/14/2021   Pharmacologic therapy 04/14/2021   Disorder of skeletal system 04/14/2021   Problems influencing health status 04/14/2021   DDD (degenerative disc disease), lumbar 04/14/2021   Osteoarthritis of spine with radiculopathy, lumbar region 04/14/2021   Abnormal MRI, lumbar spine (03/13/2018) 04/14/2021   Lumbosacral facet arthropathy 04/14/2021   Grade 1 Anterolisthesis of lumbosacral spine (L5/S1) 04/14/2021   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) 04/14/2021   Lumbar foraminal stenosis (Right: L2-3) 04/14/2021   Cervicalgia 04/15/2021   Chronic low back pain (1ry area of Pain) (Bilateral) (R>L) w/o sciatica 04/15/2021   Chronic hip pain (Right) 04/15/2021   Chronic neck pain (2ry area of  Pain) (posterior) (Bilateral) (R>L) 04/15/2021   Chronic shoulder pain (Right) 04/15/2021   Shoulder blade pain 04/15/2021   Chronic upper back pain 04/15/2021   Chronic shoulder pain (Bilateral) (R>L) 04/15/2021   Resolved Ambulatory Problems    Diagnosis Date Noted   No Resolved Ambulatory Problems   Past Medical History:  Diagnosis Date   Arthritis    Cancer ([place])    GERD (gastroesophageal reflux disease)    Hyperlipidemia    Hypertension    Lumbar disc disease    PMR (polymyalgia rheumatica) ([place])    Constitutional Exam  General appearance: Well nourished, well developed, and well hydrated. In no apparent acute distress Vitals:   04/15/21 0935  BP: (!) 181/80  Pulse: 63  Resp: 15  Temp: (!) 97.2 F (36.2 C)  SpO2: 96%  Weight: 118 lb (53.5 kg)  Height: _0  (1.575 m)   BMI Assessment: Estimated body mass index is 21.58 kg/m as calculated from the following:   Height as of this encounter: _1  (1.575 m).   Weight as of this encounter: 118 lb (53.5 kg).  BMI  interpretation table: BMI level Category Range association with higher incidence of chronic pain  <18 kg/m2 Underweight   18.5-24.9 kg/m2 Ideal body weight   25-29.9 kg/m2 Overweight Increased incidence by 20%  30-34.9 kg/m2 Obese (Class I) Increased incidence by 68%  35-39.9 kg/m2 Severe obesity (Class II) Increased incidence by 136%  >40 kg/m2 Extreme obesity (Class III) Increased incidence by 254%   Patient's current BMI Ideal Body weight  Body mass index is 21.58 kg/m. Ideal body weight: 50.1 kg (110 lb 7.2 oz) Adjusted ideal body weight: 51.5 kg (113 lb 7.5 oz)   BMI Readings from Last 4 Encounters:  04/15/21 21.58 kg/m   Wt Readings from Last 4 Encounters:  04/15/21 118 lb (53.5 kg)    Psych/Mental status: Alert, oriented x 3 (person, place, & time)       Eyes: PERLA Respiratory: No evidence of acute respiratory distress  Assessment  Primary Diagnosis & Pertinent Problem List: The primary encounter diagnosis was Chronic pain syndrome. 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  Visit Diagnosis (New problems to examiner): 1. Chronic pain syndrome   2. Chronic low back pain (1ry area of Pain) (Bilateral) (R>L) w/o sciatica   3. Osteoarthritis of spine with radiculopathy, lumbar  region   4. Lumbosacral facet arthropathy   5. Grade 1 Anterolisthesis of lumbosacral spine (L5/S1)   6. DDD (degenerative disc disease), lumbar   7. 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 Lumbar foraminal stenosis (Right: L2-3)   9. Chronic hip pain (Right)   10. Chronic neck pain (2ry area of Pain) (posterior) (Bilateral) (R>L)   11. Cervicalgia   12. Chronic shoulder pain (Bilateral) (R>L)   13. Chronic upper back pain   14. Chronic shoulder pain (Right)   15. Shoulder blade pain   16. Abnormal MRI, lumbar spine (03/13/2018)  17. Pharmacologic therapy   18. Disorder of skeletal system   19. Problems influencing health status    Plan of Care (Initial workup plan)  Note: Ms. Schwarz was reminded that as per protocol, today's visit has been an evaluation only. We have not taken over the patient's controlled substance management.  Problem-specific plan: No problem-specific Assessment & Plan notes found for this encounter.  Lab Orders         Compliance Drug Analysis, Ur         Comp. Metabolic Panel (12)         Magnesium         Vitamin B12         Sedimentation rate         25-Hydroxy vitamin D Lcms D2+D3         C-reactive protein     Imaging Orders         DG Lumbar Spine Complete W/Bend         DG Cervical Spine With Flex & Extend         DG Lumbar Spine Complete W/Bend         DG Shoulder Right         DG HIP UNILAT W OR W/O PELVIS 2-3 VIEWS RIGHT     Referral Orders  No referral(s) requested today   Procedure Orders    No procedure(s) ordered today   Pharmacotherapy (current): Medications ordered:  No orders of the defined types were placed in this encounter.  Medications administered during this visit: Darylene Price. Aken had no medications administered during this visit.   Pharmacological management options:  Opioid Analgesics: The patient was informed that there is no guarantee that she would be a candidate for opioid analgesics. The decision  will be made following CDC guidelines. This decision will be based on the results of diagnostic studies, as well as Ms. Dartt's risk profile.   Membrane stabilizer: To be determined at a later time  Muscle relaxant: To be determined at a later time  NSAID: To be determined at a later time  Other analgesic(s): To be determined at a later time   Interventional management options: Ms. Hyppolite was informed that there is no guarantee that she would be a candidate for interventional therapies. The decision will be based on the results of diagnostic studies, as well as Ms. Molloy's risk profile.  Procedure(s) under consideration:  Pending results of ordered studies      Interventional Therapies  Risk   Complexity Considerations:   Estimated body mass index is 21.58 kg/m as calculated from the following:   Height as of this encounter: _0  (1.575 m).   Weight as of this encounter: 118 lb (53.5 kg). WNL   Planned   Pending:      Under consideration:   The patient indicates not being interested in any type of procedures.   Completed:   None at this time   Therapeutic   Palliative (PRN) options:   None established    Provider-requested follow-up: No follow-ups on file.  Future Appointments  Date Time Provider [place]  04/30/2021  9:00 AM Gillis Santa, MD [facility] None     Note by: Gaspar Cola, MD Date: 04/15/2021; Time: 11:58 AM

## 2021-04-14 DIAGNOSIS — Z79899 Other long term (current) drug therapy: Secondary | ICD-10-CM | POA: Insufficient documentation

## 2021-04-14 DIAGNOSIS — M899 Disorder of bone, unspecified: Secondary | ICD-10-CM | POA: Insufficient documentation

## 2021-04-14 DIAGNOSIS — G894 Chronic pain syndrome: Secondary | ICD-10-CM | POA: Insufficient documentation

## 2021-04-14 DIAGNOSIS — M48061 Spinal stenosis, lumbar region without neurogenic claudication: Secondary | ICD-10-CM | POA: Insufficient documentation

## 2021-04-14 DIAGNOSIS — M4726 Other spondylosis with radiculopathy, lumbar region: Secondary | ICD-10-CM | POA: Insufficient documentation

## 2021-04-14 DIAGNOSIS — M5136 Other intervertebral disc degeneration, lumbar region: Secondary | ICD-10-CM | POA: Insufficient documentation

## 2021-04-14 DIAGNOSIS — M47817 Spondylosis without myelopathy or radiculopathy, lumbosacral region: Secondary | ICD-10-CM | POA: Insufficient documentation

## 2021-04-14 DIAGNOSIS — M51369 Other intervertebral disc degeneration, lumbar region without mention of lumbar back pain or lower extremity pain: Secondary | ICD-10-CM | POA: Insufficient documentation

## 2021-04-14 DIAGNOSIS — Z789 Other specified health status: Secondary | ICD-10-CM | POA: Insufficient documentation

## 2021-04-14 DIAGNOSIS — R937 Abnormal findings on diagnostic imaging of other parts of musculoskeletal system: Secondary | ICD-10-CM | POA: Insufficient documentation

## 2021-04-14 DIAGNOSIS — M4317 Spondylolisthesis, lumbosacral region: Secondary | ICD-10-CM | POA: Insufficient documentation

## 2021-04-15 ENCOUNTER — Ambulatory Visit
Admission: RE | Admit: 2021-04-15 | Discharge: 2021-04-15 | Disposition: A | Discharge status: Home / Self Care | Payer: Medicare Other | Source: Ambulatory Visit | Attending: Pain Medicine | Admitting: Pain Medicine

## 2021-04-15 ENCOUNTER — Other Ambulatory Visit: Payer: Self-pay

## 2021-04-15 ENCOUNTER — Other Ambulatory Visit: Payer: Self-pay | Admitting: Pain Medicine

## 2021-04-15 ENCOUNTER — Encounter: Payer: Self-pay | Admitting: Pain Medicine

## 2021-04-15 ENCOUNTER — Ambulatory Visit: Payer: Medicare Other | Admitting: Pain Medicine

## 2021-04-15 VITALS — BP 181/80 | HR 63 | Temp 97.2°F | Resp 15 | Ht 62.0 in | Wt 118.0 lb

## 2021-04-15 DIAGNOSIS — M899 Disorder of bone, unspecified: Secondary | ICD-10-CM

## 2021-04-15 DIAGNOSIS — Z79899 Other long term (current) drug therapy: Secondary | ICD-10-CM | POA: Insufficient documentation

## 2021-04-15 DIAGNOSIS — M898X1 Other specified disorders of bone, shoulder: Secondary | ICD-10-CM

## 2021-04-15 DIAGNOSIS — M25551 Pain in right hip: Secondary | ICD-10-CM

## 2021-04-15 DIAGNOSIS — G8929 Other chronic pain: Secondary | ICD-10-CM

## 2021-04-15 DIAGNOSIS — M549 Dorsalgia, unspecified: Secondary | ICD-10-CM | POA: Insufficient documentation

## 2021-04-15 DIAGNOSIS — M4317 Spondylolisthesis, lumbosacral region: Secondary | ICD-10-CM

## 2021-04-15 DIAGNOSIS — M542 Cervicalgia: Secondary | ICD-10-CM | POA: Insufficient documentation

## 2021-04-15 DIAGNOSIS — M4726 Other spondylosis with radiculopathy, lumbar region: Secondary | ICD-10-CM

## 2021-04-15 DIAGNOSIS — M5136 Other intervertebral disc degeneration, lumbar region: Secondary | ICD-10-CM | POA: Insufficient documentation

## 2021-04-15 DIAGNOSIS — M25512 Pain in left shoulder: Secondary | ICD-10-CM | POA: Insufficient documentation

## 2021-04-15 DIAGNOSIS — M25511 Pain in right shoulder: Secondary | ICD-10-CM | POA: Insufficient documentation

## 2021-04-15 DIAGNOSIS — Z789 Other specified health status: Secondary | ICD-10-CM | POA: Insufficient documentation

## 2021-04-15 DIAGNOSIS — R937 Abnormal findings on diagnostic imaging of other parts of musculoskeletal system: Secondary | ICD-10-CM

## 2021-04-15 DIAGNOSIS — M545 Low back pain, unspecified: Secondary | ICD-10-CM | POA: Insufficient documentation

## 2021-04-15 DIAGNOSIS — M47817 Spondylosis without myelopathy or radiculopathy, lumbosacral region: Secondary | ICD-10-CM | POA: Insufficient documentation

## 2021-04-15 DIAGNOSIS — G894 Chronic pain syndrome: Secondary | ICD-10-CM

## 2021-04-15 DIAGNOSIS — M48061 Spinal stenosis, lumbar region without neurogenic claudication: Secondary | ICD-10-CM | POA: Insufficient documentation

## 2021-04-15 NOTE — Progress Notes (Signed)
Safety precautions to be maintained throughout the outpatient stay will include: orient to surroundings, keep bed in low position, maintain call bell within reach at all times, provide assistance with transfer out of bed and ambulation.  

## 2021-04-18 LAB — 25-HYDROXY VITAMIN D LCMS D2+D3
25-Hydroxy, Vitamin D-2: <1 ng/mL
25-Hydroxy, Vitamin D-3: 54 ng/mL
25-Hydroxy, Vitamin D: 54 ng/mL

## 2021-04-18 LAB — COMP. METABOLIC PANEL (12)
AST: 20 IU/L (ref 0–40)
Albumin/Globulin Ratio: 1.9 (ref 1.2–2.2)
Albumin: 4.6 g/dL (ref 3.6–4.6)
Alkaline Phosphatase: 72 IU/L (ref 44–121)
BUN/Creatinine Ratio: 17 (ref 12–28)
BUN: 12 mg/dL (ref 8–27)
Bilirubin Total: 0.3 mg/dL (ref 0.0–1.2)
Calcium: 9.8 mg/dL (ref 8.7–10.3)
Chloride: 106 mmol/L (ref 96–106)
Creatinine, Ser: 0.7 mg/dL (ref 0.57–1.00)
Globulin, Total: 2.4 g/dL (ref 1.5–4.5)
Glucose: 89 mg/dL (ref 70–99)
Potassium: 4.6 mmol/L (ref 3.5–5.2)
Sodium: 143 mmol/L (ref 134–144)
Total Protein: 7 g/dL (ref 6.0–8.5)
eGFR: 86 mL/min/{1.73_m2} (ref >59)

## 2021-04-18 LAB — SEDIMENTATION RATE: Sed Rate: 13 mm/hr (ref 0–40)

## 2021-04-18 LAB — MAGNESIUM: Magnesium: 2.1 mg/dL (ref 1.6–2.3)

## 2021-04-18 LAB — C-REACTIVE PROTEIN: CRP: 1 mg/L (ref 0–10)

## 2021-04-18 LAB — VITAMIN B12: Vitamin B-12: 1086 pg/mL (ref 232–1245)

## 2021-04-19 LAB — COMPLIANCE DRUG ANALYSIS, UR

## 2021-04-30 ENCOUNTER — Ambulatory Visit
Payer: Medicare Other | Attending: Student in an Organized Health Care Education/Training Program | Admitting: Student in an Organized Health Care Education/Training Program

## 2021-04-30 ENCOUNTER — Other Ambulatory Visit: Payer: Self-pay

## 2021-04-30 ENCOUNTER — Encounter: Payer: Self-pay | Admitting: Student in an Organized Health Care Education/Training Program

## 2021-04-30 VITALS — BP 191/78 | HR 61 | Temp 97.1°F | Ht 62.0 in | Wt 118.0 lb

## 2021-04-30 DIAGNOSIS — M48061 Spinal stenosis, lumbar region without neurogenic claudication: Secondary | ICD-10-CM | POA: Diagnosis present

## 2021-04-30 DIAGNOSIS — M4726 Other spondylosis with radiculopathy, lumbar region: Secondary | ICD-10-CM | POA: Insufficient documentation

## 2021-04-30 DIAGNOSIS — G8929 Other chronic pain: Secondary | ICD-10-CM | POA: Diagnosis present

## 2021-04-30 DIAGNOSIS — Z0289 Encounter for other administrative examinations: Secondary | ICD-10-CM | POA: Insufficient documentation

## 2021-04-30 DIAGNOSIS — M47817 Spondylosis without myelopathy or radiculopathy, lumbosacral region: Secondary | ICD-10-CM | POA: Insufficient documentation

## 2021-04-30 DIAGNOSIS — M545 Low back pain, unspecified: Secondary | ICD-10-CM

## 2021-04-30 DIAGNOSIS — M4317 Spondylolisthesis, lumbosacral region: Secondary | ICD-10-CM | POA: Diagnosis present

## 2021-04-30 DIAGNOSIS — Z79891 Long term (current) use of opiate analgesic: Secondary | ICD-10-CM | POA: Diagnosis present

## 2021-04-30 DIAGNOSIS — G894 Chronic pain syndrome: Secondary | ICD-10-CM | POA: Insufficient documentation

## 2021-04-30 MED ORDER — HYDROCODONE-ACETAMINOPHEN 5-325 MG PO TABS: 1.0000 | ORAL_TABLET | Freq: Two times a day (BID) | ORAL | 0 refills | Status: DC | PRN

## 2021-04-30 MED ORDER — HYDROCODONE-ACETAMINOPHEN 5-325 MG PO TABS: 1.0000 | ORAL_TABLET | Freq: Two times a day (BID) | ORAL | 0 refills | Status: AC | PRN

## 2021-04-30 NOTE — Progress Notes (Signed)
Safety precautions to be maintained throughout the outpatient stay will include: orient to surroundings, keep bed in low position, maintain call bell within reach at all times, provide assistance with transfer out of bed and ambulation.  

## 2021-04-30 NOTE — Patient Instructions (Signed)
Sign pain contract.

## 2021-04-30 NOTE — Progress Notes (Signed)
PROVIDER NOTE: Information contained herein reflects review and annotations entered in association with encounter. Interpretation of such information and data should be left to medically-trained personnel. Information provided to patient can be located elsewhere in the medical record under "Patient Instructions". Document created using STT-dictation technology, any transcriptional errors that may result from process are unintentional.    Patient: Caroline Campbell  Service Category: E/M  Provider: Gillis Santa, MD  DOB: 1935/06/23  DOS: 04/30/2021  Specialty: Interventional Pain Management  MRN: [id number]  Setting: Ambulatory outpatient  PCP: Kirk Ruths, MD  Type: Established Patient    Referring Provider: Kirk Ruths, MD  Location: Office  Delivery: Face-to-face     Primary Reason(s) for Visit: Encounter for evaluation before starting new chronic pain management plan of care (Level of risk: moderate) CC: Back Pain (lower)  HPI  Caroline Campbell is a 86 y.o. year old, female patient, who comes today for a follow-up evaluation to review the test results and decide on a treatment plan. She has Aortic atherosclerosis ([place]); Chronic sciatica, right; Essential hypertension with goal blood pressure less than 140/90; GERD without esophagitis; Hand weakness; Health care maintenance; Irritable bowel; Paresthesia of both hands; Pure hypercholesterolemia; Right leg numbness; Chronic pain syndrome; Pharmacologic therapy; Disorder of skeletal system; Problems influencing health status; DDD (degenerative disc disease), lumbar; Osteoarthritis of spine with radiculopathy, lumbar region; Abnormal MRI, lumbar spine (03/13/2018); Lumbosacral facet arthropathy; Grade 1 Anterolisthesis of lumbosacral spine (L5/S1); 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 Her primarily concern today is the Back Pain (lower)  Pain Assessment: Location: Right, Left Back Radiating: pain radiaties down to her right with heaviness Onset: More than a month ago Duration: Chronic pain Quality: Aching, Dull, Heaviness Severity: 8 /10 (subjective, self-reported pain score)  Effect on ADL: limits my daily activities Timing: Constant Modifying factors: heat, meds, laying down BP: (!) 191/78   HR: 61  Caroline Campbell comes in today for a follow-up visit after her initial evaluation on 04/15/2021. Today we went over the results of her tests. These were explained in "Layman's terms". During today's appointment we went over my diagnostic impression, as well as the proposed treatment plan.   Patient was evaluated by my colleague, Dr. Dossie Arbour for initial clinic visit on 04/15/2021.  Please see excerpt from note below.  Pharmacotherapy: According to the patient she has been managing her pain with hydrocodone/APAP 10/325 tablets, 1 tab p.o. twice daily as needed for pain.  She describes having use this form of therapy for a long time without any problems until recently when she was tested at the [facility] and found to have a urine drug screening test negative for her pain medication.  At that point, she was discharged from the service.  As an explanation for this, the patient indicates that they are very last prescription was obtained at the [facility] in [facility], which closed about a week before she had her prescription filled.  When she obtained that prescription she commented to her children that there were something wrong with the medicine since it did not seem to control her pain.  She refers that  she honestly thinks that the prescription was switched to something else and this is the reason why it was not working and it did not trigger the urine drug screen test.  Today we requested a copy of the drug screening test to determine if it was an immunoassay test that have been done, but according to [facility], it is exactly the same type of test that we normally order.  However, no medication list was supplied with the test and the test simply reads "no medications found".  We found this to be rather odd since our test will normally document those medications that the patient is taking including those that are not controlled substances.  According to the patient's PMP she has been using her medication regularly and has not been "doctor shopping".  In addition, the medication reconciliation shows that the patient takes several medications including aspirin, Excedrin migraine, Lipitor, Neurontin, Toprol-XL, Protonix, cyanocobalamin, none of which were mentioned in her urine drug screen test.  The only thing that the test reported was the patient's creatinine at 78.  Today the patient was asked if she still had some of the medication left and she indicated that she does.  We have requested that she bring it in to be analyzed.   The patient indicates that she has not taking any of the hydrocodone since Thanksgiving when they stopped prescribing it for her.   Since the patient had indicated that she was not interested in any type of interventional therapy, and I am currently not taking any patients for medication management secondary to lack of manpower, I have offered the patient to complete her evaluation and to present the case to Dr. Gillis Santa for him to evaluate and decide whether or not he wants to take on the case.  The patient has been made aware today that when she returns she will be seen Dr. Holley Raring and not me.  She indicated being okay with this.  I have reviewed the patient's case and will be taking  her on for chronic pain management managing her hydrocodone 5 mg twice daily as needed.  Controlled Substance Pharmacotherapy Assessment REMS (Risk Evaluation and Mitigation Strategy)  Opioid Analgesic:Hydrocodone/APAP 5/325 tablets, 1 tab p.o. twice daily (10 mg/day of hydrocodone) (last prescription filled on 12/09/2020). Pill Count: None expected due to no prior prescriptions written by our practice. Chauncey Fischer, RN  04/30/2021  8:38 AM  Sign when Signing Visit Safety precautions to be maintained throughout the outpatient stay will include: orient to surroundings, keep bed in low position, maintain call bell within reach at all times, provide assistance with transfer out of bed and ambulation.    Pharmacokinetics: Liberation and absorption (onset of action): WNL Distribution (time to peak effect): WNL Metabolism and excretion (duration of action): WNL         Pharmacodynamics: Desired effects: Analgesia: Caroline Campbell reports >50% benefit. Functional ability: Patient reports that medication allows her to accomplish basic ADLs Clinically meaningful improvement in function (CMIF): Sustained CMIF goals met Perceived effectiveness: Described as relatively effective, allowing for increase in activities of daily living (ADL) Undesirable effects: Side-effects or Adverse reactions: None reported Monitoring: [place] PMP: PDMP not reviewed this encounter. Online review of the past 55-monthperiod previously conducted. Not applicable at this point since we have not taken over the patient's medication management yet. List of other Serum/Urine Drug Screening Test(s):  No results found for: AMPHSCRSER, [place] PStella TNorth [place] TEustis CBo[place] ESan AntonioList of  all UDS test(s) done:  Lab Results  Component Value Date   SUMMARY Note 04/15/2021   Last UDS on record: Summary  Date Value Ref Range Status  04/15/2021  Note  Final    Comment:    ==================================================================== Compliance Drug Analysis, Ur ==================================================================== Test                             Result       Flag       Units  Drug Present and Declared for Prescription Verification   Gabapentin                     PRESENT      EXPECTED   Acetaminophen                  PRESENT      EXPECTED   Salicylate                     PRESENT      EXPECTED   Metoprolol                     PRESENT      EXPECTED  Drug Present not Declared for Prescription Verification   Diphenhydramine                PRESENT      UNEXPECTED  Drug Absent but Declared for Prescription Verification   Lidocaine                      Not Detected UNEXPECTED    Lidocaine, as indicated in the declared medication list, is not    always detected even when used as directed.  ==================================================================== Test                      Result    Flag   Units      Ref Range   Creatinine              136              mg/dL      >=20 ==================================================================== Declared Medications:  The flagging and interpretation on this report are based on the  following declared medications.  Unexpected results may arise from  inaccuracies in the declared medications.   **Note: The testing scope of this panel includes these medications:   Gabapentin (Neurontin)  Metoprolol (Toprol)   **Note: The testing scope of this panel does not include small to  moderate amounts of these reported medications:   Acetaminophen  Aspirin  Lidocaine   **Note: The testing scope of this panel does not include the  following reported medications:   Atorvastatin (Lipitor)  Chondroitin  Cyanocobalamin  Glucosamine  Omega-3 Fatty Acids  Pantoprazole (Protonix)  Ubiquinone (CoQ10)  Vitamin  E ==================================================================== For clinical consultation, please call [phone]. ====================================================================    UDS interpretation: No unexpected findings.          Medication Assessment Form: Patient introduced to form today Treatment compliance: Treatment may start today if patient agrees with proposed plan. Evaluation of compliance is not applicable at this point Risk Assessment Profile: Aberrant behavior: See initial evaluations. None observed or detected today Comorbid factors increasing risk of overdose: See initial evaluation. No additional risks detected today Opioid risk tool (ORT):  Opioid Risk  04/15/2021  Alcohol 0  Illegal Drugs  3  Rx Drugs 0  Alcohol 0  Illegal Drugs 0  Rx Drugs 0  Age between 16-45 years  0  History of Preadolescent Sexual Abuse 0  Psychological Disease 0  Depression 0  Opioid Risk Tool Scoring 3  Opioid Risk Interpretation Low Risk    ORT Scoring interpretation table:  Score <3 = Low Risk for SUD  Score between 4-7 = Moderate Risk for SUD  Score >8 = High Risk for Opioid Abuse   Risk of substance use disorder (SUD): Low  Risk Mitigation Strategies:  Patient opioid safety counseling: Completed today. Counseling provided to patient as per "Patient Counseling Document". Document signed by patient, attesting to counseling and understanding Patient-Prescriber Agreement (PPA): Obtained today.  Controlled substance notification to other providers: Written and sent today.  Pharmacologic Plan: Non-opioid analgesic therapy offered. Interventional alternatives discussed.             Laboratory Chemistry Profile   Renal Lab Results  Component Value Date   BUN 12 04/15/2021   CREATININE 0.70 04/15/2021   BCR 17 04/15/2021     Electrolytes Lab Results  Component Value Date   NA 143 04/15/2021   K 4.6 04/15/2021   CL 106 04/15/2021   CALCIUM 9.8 04/15/2021    MG 2.1 04/15/2021     Hepatic Lab Results  Component Value Date   AST 20 04/15/2021   ALBUMIN 4.6 04/15/2021   ALKPHOS 72 04/15/2021     ID Lab Results  Component Value Date   SARSCOV2NAA Detected (A) 03/21/2019     Bone Lab Results  Component Value Date   25OHVITD1 54 04/15/2021   25OHVITD2 <1.0 04/15/2021   25OHVITD3 54 04/15/2021     Endocrine Lab Results  Component Value Date   GLUCOSE 89 04/15/2021     Neuropathy Lab Results  Component Value Date   VITAMINB12 1,086 04/15/2021     CNS No results found for: COLORCSF, APPEARCSF, RBCCOUNTCSF, WBCCSF, POLYSCSF, LYMPHSCSF, EOSCSF, PROTEINCSF, GLUCCSF, JCVIRUS, CSFOLI, IGGCSF, LABACHR, ACETBL, LABACHR, ACETBL   Inflammation (CRP: Acute   ESR: Chronic) Lab Results  Component Value Date   CRP 1 04/15/2021   ESRSEDRATE 13 04/15/2021     Rheumatology No results found for: RF, ANA, LABURIC, URICUR, LYMEIGGIGMAB, LYMEABIGMQN, HLAB27   Coagulation No results found for: INR, LABPROT, APTT, PLT, DDIMER, LABHEMA, VITAMINK1, AT3   Cardiovascular No results found for: BNP, CKTOTAL, CKMB, TROPONINI, HGB, HCT, LABVMA, EPIRU, EPINEPH24HUR, NOREPRU, NOREPI24HUR, DOPARU, [id number]   Screening Lab Results  Component Value Date   SARSCOV2NAA Detected (A) 03/21/2019     Cancer No results found for: CEA, CA125, LABCA2   Allergens No results found for: ALMOND, APPLE, ASPARAGUS, AVOCADO, BANANA, BARLEY, BASIL, BAYLEAF, GREENBEAN, LIMABEAN, WHITEBEAN, BEEFIGE, REDBEET, BLUEBERRY, BROCCOLI, CABBAGE, MELON, CARROT, CASEIN, CASHEWNUT, CAULIFLOWER, CELERY     Note: Lab results reviewed.  Recent Diagnostic Imaging Review   Narrative CLINICAL DATA:  Cervicalgia  EXAM: CERVICAL SPINE COMPLETE WITH FLEXION AND EXTENSION VIEWS  COMPARISON:  X-ray cervical spine 01/08/2013  FINDINGS: On the lateral view the cervical spine is visualized to the level of C7. Normal cervical lordosis. On flexion view there is development of  grade 1 anterolisthesis of C2 on C3, C3 on C4, C4 on C5.  Dens is well positioned between the lateral masses of C1. There is limited evaluation of the dens for acute fracture on the open-mouth view due to overlying osseous structures.  Diffusely decreased bone density. Mild-to-moderate degenerative change of the spine at the  C5 through C7 levels. Osseous neural foraminal stenosis at the C5-C6 level on the left. No acute displaced fracture is detected.No aggressive-appearing focal osseous lesions.  Pre-vertebral soft tissues are within normal limits.  IMPRESSION: 1. On flexion view there is development of grade 1 anterolisthesis of C2 on C3, C3 on C4, C4 on C5. 2. Mild-to-moderate degenerative changes of the C5 through C7 levels with associated osseous neural foraminal stenosis at the C5-C6 level on the left. 3. No acute displaced fracture or traumatic listhesis of the cervical spine.   Electronically Signed By: Iven Finn M.D. On: 04/15/2021 18:46   Narrative CLINICAL DATA:  Right shoulder pain  EXAM: RIGHT SHOULDER - 2+ VIEW  COMPARISON:  None.  FINDINGS: No acute fracture or dislocation identified. Mild joint space narrowing. No suspicious bony lesions identified.  IMPRESSION: No acute osseous abnormality identified.   Electronically Signed By: Ofilia Neas M.D. On: 04/15/2021 16:44 Narrative CLINICAL DATA:  Back pain.  EXAM: THORACIC SPINE - 3 VIEWS  COMPARISON:  Chest radiograph dated 05/01/2012.  FINDINGS: No acute fracture or subluxation. The bones are osteopenic. Multilevel degenerative changes and scoliosis. Atherosclerotic calcification of the aorta. The soft tissues are unremarkable.  IMPRESSION: No acute/traumatic thoracic spine pathology.   Electronically Signed By: Anner Crete M.D. On: 04/15/2021 20:22   Lumbosacral Imaging: Lumbar MR wo contrast: Results for orders placed during the hospital encounter of  03/13/18  MR LUMBAR SPINE WO CONTRAST  Narrative CLINICAL DATA:  Chronic low back pain with numbness and tingling in the right leg.  EXAM: MRI LUMBAR SPINE WITHOUT CONTRAST  TECHNIQUE: Multiplanar, multisequence MR imaging of the lumbar spine was performed. No intravenous contrast was administered.  COMPARISON:  CT scan 04/08/2015  FINDINGS: Segmentation: There are five lumbar type vertebral bodies. The last full intervertebral disc space is labeled L5-S1.  Alignment:  Normal  Vertebrae:  No bone lesions or fracture.  Conus medullaris and cauda equina: Conus extends to the T12-L1 level. Conus and cauda equina appear normal.  Paraspinal and other soft tissues: No significant findings.  Disc levels:  T12-L1: No significant findings.  L1-2: No significant findings.  L2-3: Moderate degenerate disc disease with a bulging annulus and mainly right-sided osteophytic ridging. There is bilateral lateral recess stenosis, right greater than left and mild right foraminal encroachment but no direct neural compression.  L3-4: No significant findings.  L4-5: Moderate facet disease and mild bulging annulus but no significant spinal, lateral recess or foraminal stenosis.  L5-S1: Mild degenerative anterolisthesis of L5. The spinal canal is generous. No spinal or foraminal stenosis. Moderate facet disease.  IMPRESSION: 1. Moderate degenerate disc disease at L2-3 with a bulging annulus and mainly right-sided osteophytic ridging. Mild bilateral lateral recess stenosis, right greater than left along with mild right foraminal encroachment. 2. No other significant findings.   Electronically Signed By: Marijo Sanes M.D. On: 03/13/2018 15:24  Narrative CLINICAL DATA:  Low back pain  EXAM: LUMBAR SPINE - COMPLETE WITH BENDING VIEWS  COMPARISON:  X-ray lumbar spine 05/01/2012  FINDINGS: Markedly limited evaluation due to overlapping osseous structures and overlying soft  tissues.  Five non-rib-bearing lumbar vertebral bodies. Slight levocurvature of the lumbar spine centered at the L2-L3 level. Grade 1 anterolisthesis of L5 on S1. No change in alignment on flexion and extension views. Multilevel degenerative changes of the spine. There is no evidence of lumbar spine fracture. Alignment is normal. Intervertebral disc spaces are maintained. Aortic calcification.  IMPRESSION: 1. No acute displaced fracture or traumatic  listhesis of the lumbar spine. 2. Grade 1 anterolisthesis of L5 on S1 with no change in alignment on extension and flexion views. 3.  Aortic Atherosclerosis (ICD10-I70.0).   Electronically Signed By: Iven Finn M.D. On: 04/15/2021 20:28  DG HIP UNILAT W OR W/O PELVIS 2-3 VIEWS RIGHT  Narrative CLINICAL DATA:  Right hip pain  EXAM: DG HIP (WITH OR WITHOUT PELVIS) 2-3V RIGHT  COMPARISON:  None.  FINDINGS: No acute fracture or dislocation identified in the right hip. Moderate joint space narrowing with acetabular subchondral sclerosis and small marginal osteophyte formation. No acute fracture identified in the pelvis. Bones are osteopenic.  IMPRESSION: Chronic changes with no acute osseous abnormality identified.   Electronically Signed By: Ofilia Neas M.D. On: 04/15/2021 16:45   Complexity Note: Imaging results reviewed. Results shared with Ms. Devery, using [employer].                         Meds   Current Outpatient Medications:    Aspirin-Acetaminophen (GOODYS BODY PAIN PO), Take by mouth 3 (three) times daily., Disp: , Rfl:    atorvastatin (LIPITOR) 20 MG tablet, Take 20 mg by mouth daily., Disp: , Rfl:    Coenzyme Q10 10 MG capsule, Take 10 mg by mouth daily., Disp: , Rfl:    gabapentin (NEURONTIN) 100 MG capsule, Take 100 mg by mouth 3 (three) times daily., Disp: , Rfl:    glucosamine-chondroitin 500-400 MG tablet, Take 1 tablet by mouth 3 (three) times daily., Disp: , Rfl:     HYDROcodone-acetaminophen (NORCO/VICODIN) 5-325 MG tablet, Take 1 tablet by mouth every 12 (twelve) hours as needed for severe pain. Must last 30 days., Disp: 60 tablet, Rfl: 0   [START ON 05/30/2021] HYDROcodone-acetaminophen (NORCO/VICODIN) 5-325 MG tablet, Take 1 tablet by mouth every 12 (twelve) hours as needed for severe pain. Must last 30 days., Disp: 60 tablet, Rfl: 0   Lidocaine 4 % PTCH, Apply topically once., Disp: , Rfl:    metoprolol succinate (TOPROL-XL) 50 MG 24 hr tablet, Take 50 mg by mouth daily. Take with or immediately following a meal., Disp: , Rfl:    OMEGA-3 FATTY ACIDS PO, Take 1,000 mg by mouth 2 (two) times daily., Disp: , Rfl:    pantoprazole (PROTONIX) 40 MG tablet, Take 40 mg by mouth daily., Disp: , Rfl:    vitamin B-12 (CYANOCOBALAMIN) 100 MCG tablet, Take 100 mcg by mouth daily., Disp: , Rfl:    Vitamin E Acetate 1 UNIT/MG LIQD, by Does not apply route daily., Disp: , Rfl:   ROS  Constitutional: Denies any fever or chills Gastrointestinal: No reported hemesis, hematochezia, vomiting, or acute GI distress Musculoskeletal: Denies any acute onset joint swelling, redness, loss of ROM, or weakness Neurological: No reported episodes of acute onset apraxia, aphasia, dysarthria, agnosia, amnesia, paralysis, loss of coordination, or loss of consciousness  Allergies  Caroline Campbell is allergic to panmycin [tetracycline] and crestor [rosuvastatin calcium].  PFSH  Drug: Caroline Campbell  reports no history of drug use. Alcohol:  reports current alcohol use. Tobacco:  reports that she has quit smoking. She has never used smokeless tobacco. Medical:  has a past medical history of Arthritis, Cancer ([place]), GERD (gastroesophageal reflux disease), Hyperlipidemia, Hypertension, Lumbar disc disease, and PMR (polymyalgia rheumatica) ([place]). Surgical: Caroline Campbell  has a past surgical history that includes Abdominal hysterectomy; Colonoscopy with propofol (N/A, 09/06/2017); and  Esophagogastroduodenoscopy (egd) with propofol (N/A, 09/06/2017). Family: family history is not on file.  Constitutional Exam  General appearance: Well nourished, well developed, and well hydrated. In no apparent acute distress Vitals:   04/30/21 0838  BP: (!) 191/78  Pulse: 61  Temp: (!) 97.1 F (36.2 C)  SpO2: 100%  Weight: 118 lb (53.5 kg)  Height: '5\' 2"'  (1.575 m)   BMI Assessment: Estimated body mass index is 21.58 kg/m as calculated from the following:   Height as of this encounter: '5\' 2"'  (1.575 m).   Weight as of this encounter: 118 lb (53.5 kg).  BMI interpretation table: BMI level Category Range association with higher incidence of chronic pain  <18 kg/m2 Underweight   18.5-24.9 kg/m2 Ideal body weight   25-29.9 kg/m2 Overweight Increased incidence by 20%  30-34.9 kg/m2 Obese (Class I) Increased incidence by 68%  35-39.9 kg/m2 Severe obesity (Class II) Increased incidence by 136%  >40 kg/m2 Extreme obesity (Class III) Increased incidence by 254%   Patient's current BMI Ideal Body weight  Body mass index is 21.58 kg/m. Ideal body weight: 50.1 kg (110 lb 7.2 oz) Adjusted ideal body weight: 51.5 kg (113 lb 7.5 oz)   BMI Readings from Last 4 Encounters:  04/30/21 21.58 kg/m  04/15/21 21.58 kg/m   Wt Readings from Last 4 Encounters:  04/30/21 118 lb (53.5 kg)  04/15/21 118 lb (53.5 kg)    Psych/Mental status: Alert, oriented x 3 (person, place, & time)       Eyes: PERLA Respiratory: No evidence of acute respiratory distress  Cervical Spine Area Exam  Skin & Axial Inspection: No masses, redness, edema, swelling, or associated skin lesions Alignment: Symmetrical Functional ROM: Pain restricted ROM      Stability: No instability detected Muscle Tone/Strength: Functionally intact. No obvious neuro-muscular anomalies detected. Sensory (Neurological): Musculoskeletal pain pattern Palpation: No palpable anomalies             Upper Extremity (UE) Exam    Side:  Right upper extremity  Side: Left upper extremity  Skin & Extremity Inspection: Skin color, temperature, and hair growth are WNL. No peripheral edema or cyanosis. No masses, redness, swelling, asymmetry, or associated skin lesions. No contractures.  Skin & Extremity Inspection: Skin color, temperature, and hair growth are WNL. No peripheral edema or cyanosis. No masses, redness, swelling, asymmetry, or associated skin lesions. No contractures.  Functional ROM: Unrestricted ROM          Functional ROM: Unrestricted ROM          Muscle Tone/Strength: Functionally intact. No obvious neuro-muscular anomalies detected.  Muscle Tone/Strength: Functionally intact. No obvious neuro-muscular anomalies detected.  Sensory (Neurological): Unimpaired          Sensory (Neurological): Unimpaired          Palpation: No palpable anomalies              Palpation: No palpable anomalies              Provocative Test(s):  Phalen's test: deferred Tinel's test: deferred Apley's scratch test (touch opposite shoulder):  Action 1 (Across chest): deferred Action 2 (Overhead): deferred Action 3 (LB reach): deferred   Provocative Test(s):  Phalen's test: deferred Tinel's test: deferred Apley's scratch test (touch opposite shoulder):  Action 1 (Across chest): deferred Action 2 (Overhead): deferred Action 3 (LB reach): deferred    Lumbar Spine Area Exam  Skin & Axial Inspection: No masses, redness, or swelling Alignment: Symmetrical Functional ROM: Pain restricted ROM affecting both sides Stability: No instability detected Muscle Tone/Strength: Functionally intact. No obvious neuro-muscular anomalies  detected. Sensory (Neurological): Dermatomal pain pattern and MSK  Lower Extremity Exam    Side: Right lower extremity  Side: Left lower extremity  Stability: No instability observed          Stability: No instability observed          Skin & Extremity Inspection: Skin color, temperature, and hair growth are WNL. No  peripheral edema or cyanosis. No masses, redness, swelling, asymmetry, or associated skin lesions. No contractures.  Skin & Extremity Inspection: Skin color, temperature, and hair growth are WNL. No peripheral edema or cyanosis. No masses, redness, swelling, asymmetry, or associated skin lesions. No contractures.  Functional ROM: Pain restricted ROM for hip joint          Functional ROM: Pain restricted ROM for hip joint          Muscle Tone/Strength: Functionally intact. No obvious neuro-muscular anomalies detected.  Muscle Tone/Strength: Functionally intact. No obvious neuro-muscular anomalies detected.  Sensory (Neurological): Musculoskeletal pain pattern        Sensory (Neurological): Musculoskeletal pain pattern        DTR: Patellar: deferred today Achilles: deferred today Plantar: deferred today  DTR: Patellar: deferred today Achilles: deferred today Plantar: deferred today  Palpation: No palpable anomalies  Palpation: No palpable anomalies    Assessment & Plan  Primary Diagnosis & Pertinent Problem List: The primary encounter diagnosis was Chronic pain syndrome. 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  Visit Diagnosis: 1. Chronic pain syndrome   2. Osteoarthritis of spine with radiculopathy, lumbar region   3. Lumbosacral facet arthropathy   4. Chronic low back pain (1ry area of Pain) (Bilateral) (R>L) w/o sciatica   5. Grade 1 Anterolisthesis of lumbosacral spine (L5/S1)   6. Lumbar foraminal stenosis (Right: L2-3)   7. 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 Pain medication  agreement signed   9. Encounter for long-term opiate analgesic use    Problems updated and reviewed during this visit: Problem  Pain Medication Agreement Signed  Encounter for Long-Term Opiate Analgesic Use    Plan of Care  Pharmacotherapy (Medications Ordered): Meds ordered this encounter  Medications   HYDROcodone-acetaminophen (NORCO/VICODIN) 5-325 MG tablet    Sig: Take 1 tablet by mouth every 12 (twelve) hours as needed for severe pain. Must last 30 days.    Dispense:  60 tablet    Refill:  0    Chronic Pain: STOP Act (Not applicable) Fill 1 day early if closed on refill date. Avoid benzodiazepines within 8 hours of opioids   HYDROcodone-acetaminophen (NORCO/VICODIN) 5-325 MG tablet    Sig: Take 1 tablet by mouth every 12 (twelve) hours as needed for severe pain. Must last 30 days.    Dispense:  60 tablet    Refill:  0    Chronic Pain: STOP Act (Not applicable) Fill 1 day early if closed on refill date. Avoid benzodiazepines within 8 hours of opioids     Pharmacological management options:  Opioid Analgesics: We'll take over management today. See above orders Membrane stabilizer: Currently on a membrane stabilizer Muscle relaxant: I will not be prescribing any at this time patient does take Goodys prn NSAID: None prescribed at this  time  Other analgesic(s):  Lidocaine patch, glucosamine, conjoint and for joint health    Provider-requested follow-up: Return in about 8 weeks (around 06/25/2021) for Medication Management, in person. Recent Visits Date Type Provider Dept  04/15/21 Office Visit Milinda Pointer, MD [facility]  Showing recent visits within past 90 days and meeting all other requirements Today's Visits Date Type Provider Dept  04/30/21 Office Visit Gillis Santa, MD [facility]  Showing today's visits and meeting all other requirements Future Appointments Date Type Provider Dept  06/18/21 Appointment Gillis Santa, MD [facility]  Showing future appointments within next 90 days and meeting all other requirements  Primary Care Physician: Kirk Ruths, MD Note by: Gillis Santa, MD Date: 04/30/2021; Time: 9:14 AM

## 2021-06-01 ENCOUNTER — Telehealth: Payer: Self-pay | Admitting: Student in an Organized Health Care Education/Training Program

## 2021-06-01 ENCOUNTER — Other Ambulatory Visit: Payer: Self-pay | Admitting: Student in an Organized Health Care Education/Training Program

## 2021-06-01 MED ORDER — HYDROCODONE-ACETAMINOPHEN 5-325 MG PO TABS: 1.0000 | ORAL_TABLET | Freq: Two times a day (BID) | ORAL | 0 refills | Status: DC | PRN

## 2021-06-01 NOTE — Progress Notes (Signed)
Patient notified per voicemail, and Hydrocodone script at [facility] cancelled.

## 2021-06-01 NOTE — Telephone Encounter (Signed)
[facility] does not have patient's meds. She called [facility] on [address] and they have it. She is asking if Dr. Holley Raring can send script to this pharmacy please. She would like a call once this has been done so she can go pick it up.

## 2021-06-18 ENCOUNTER — Ambulatory Visit
Payer: Medicare Other | Attending: Student in an Organized Health Care Education/Training Program | Admitting: Student in an Organized Health Care Education/Training Program

## 2021-06-18 ENCOUNTER — Other Ambulatory Visit: Payer: Self-pay

## 2021-06-18 ENCOUNTER — Encounter: Payer: Self-pay | Admitting: Student in an Organized Health Care Education/Training Program

## 2021-06-18 VITALS — BP 156/85 | HR 54 | Temp 97.5°F | Resp 16 | Ht 62.0 in | Wt 116.0 lb

## 2021-06-18 DIAGNOSIS — G894 Chronic pain syndrome: Secondary | ICD-10-CM

## 2021-06-18 DIAGNOSIS — M47817 Spondylosis without myelopathy or radiculopathy, lumbosacral region: Secondary | ICD-10-CM

## 2021-06-18 DIAGNOSIS — M4726 Other spondylosis with radiculopathy, lumbar region: Secondary | ICD-10-CM | POA: Diagnosis not present

## 2021-06-18 DIAGNOSIS — M4317 Spondylolisthesis, lumbosacral region: Secondary | ICD-10-CM

## 2021-06-18 DIAGNOSIS — M48061 Spinal stenosis, lumbar region without neurogenic claudication: Secondary | ICD-10-CM

## 2021-06-18 DIAGNOSIS — M5136 Other intervertebral disc degeneration, lumbar region: Secondary | ICD-10-CM

## 2021-06-18 DIAGNOSIS — G8929 Other chronic pain: Secondary | ICD-10-CM

## 2021-06-18 DIAGNOSIS — Z0289 Encounter for other administrative examinations: Secondary | ICD-10-CM | POA: Diagnosis not present

## 2021-06-18 DIAGNOSIS — M545 Low back pain, unspecified: Secondary | ICD-10-CM

## 2021-06-18 DIAGNOSIS — Z79891 Long term (current) use of opiate analgesic: Secondary | ICD-10-CM

## 2021-06-18 MED ORDER — HYDROCODONE-ACETAMINOPHEN 5-325 MG PO TABS: 1.0000 | ORAL_TABLET | Freq: Two times a day (BID) | ORAL | 0 refills | Status: DC | PRN

## 2021-06-18 MED ORDER — HYDROCODONE-ACETAMINOPHEN 5-325 MG PO TABS: 1.0000 | ORAL_TABLET | Freq: Two times a day (BID) | ORAL | 0 refills | Status: AC | PRN

## 2021-06-18 NOTE — Progress Notes (Signed)
Nursing Pain Medication Assessment:  ?Safety precautions to be maintained throughout the outpatient stay will include: orient to surroundings, keep bed in low position, maintain call bell within reach at all times, provide assistance with transfer out of bed and ambulation.  ?Medication Inspection Compliance: Pill count conducted under aseptic conditions, in front of the patient. Neither the pills nor the bottle was removed from the patient's sight at any time. Once count was completed pills were immediately returned to the patient in their original bottle. ? ?Medication: Hydrocodone/APAP ?Pill/Patch Count:  27 of 60 pills remain ?Pill/Patch Appearance: Markings consistent with prescribed medication ?Bottle Appearance: Standard pharmacy container. Clearly labeled. ?Filled Date: 02 / 20 / 2023 ?Last Medication intake:  Today ?

## 2021-06-18 NOTE — Progress Notes (Signed)
PROVIDER NOTE: Information contained herein reflects review and annotations entered in association with encounter. Interpretation of such information and data should be left to medically-trained personnel. Information provided to patient can be located elsewhere in the medical record under "Patient Instructions". Document created using STT-dictation technology, any transcriptional errors that may result from process are unintentional.    Patient: Caroline Campbell  Service Category: E/M  Provider: Gillis Santa, MD  DOB: 14-Jul-1935  DOS: 06/18/2021  Specialty: Interventional Pain Management  MRN: [id number]  Setting: Ambulatory outpatient  PCP: Kirk Ruths, MD  Type: Established Patient    Referring Provider: Kirk Ruths, MD  Location: Office  Delivery: Face-to-face     HPI  [name], a 86 y.o. year old female, is here today because of her Chronic pain syndrome [G89.4]. [name] primary complain today is Back Pain (lower) Last encounter: My last encounter with her was on 06/01/2021. Pertinent problems: 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 Pain Assessment: Severity of Chronic pain is reported as a 5 /10. Location: Back Lower/right hip, right leg feels like it is "asleep". Onset: More than a month ago. Quality: Dull, Aching, Constant. Timing: Constant. Modifying factor(s): medications, Goody's, heat, Aspercreme patches. Vitals:  height is '5\' 2"'  (1.575 m) and weight is 116 lb (52.6 kg). Her temporal temperature  is 97.5 F (36.4 C) (abnormal). Her blood pressure is 156/85 (abnormal) and her pulse is 54 (abnormal). Her respiration is 16 and oxygen saturation is 99%.   Reason for encounter: medication management.   Patient presents today for medication management of her hydrocodone.  She takes 1 tablet in the morning and 1 tablet after lunch.  She states that on some evenings she would benefit from taking her hydrocodone in the evening to facilitate sleep rest. No falls, constipation, nausea.  We will adjust her hydrocodone from quantity 60/month to quantity 75 so that she can have extra 15 tablets/ month to take additional dose at night on days that she has increased pain.  Pharmacotherapy Assessment  Analgesic: 5 mg every 12 hours as needed with an extra 15 tablets/month to take for breakthrough pain on evenings when her pain is higher   Monitoring:  PMP: PDMP reviewed during this encounter.       Pharmacotherapy: No side-effects or adverse reactions reported. Compliance: No problems identified. Effectiveness: Clinically acceptable.  Landis Martins, RN  06/18/2021  8:44 AM  Sign when Signing Visit Nursing Pain Medication Assessment:  Safety precautions to be maintained throughout the outpatient stay will include: orient to surroundings, keep bed in low position, maintain call bell within reach at all times, provide assistance with transfer out of bed and ambulation.  Medication Inspection Compliance: Pill count conducted under aseptic conditions, in front of the patient. Neither the pills nor the bottle was removed from the patient's sight at any time. Once count was completed pills were immediately returned to the patient in their original bottle.  Medication: Hydrocodone/APAP Pill/Patch Count:  27 of 60 pills remain Pill/Patch Appearance: Markings consistent with prescribed medication Bottle Appearance: Standard pharmacy container. Clearly labeled. Filled Date: 02 / 20 / 2023 Last Medication  intake:  Today     UDS:  Summary  Date Value Ref Range Status  04/15/2021 Note  Final    Comment:    ==================================================================== Compliance Drug Analysis, Ur ==================================================================== Test                             Result       Flag       Units  Drug Present and Declared for Prescription Verification   Gabapentin                     PRESENT      EXPECTED   Acetaminophen                  PRESENT      EXPECTED   Salicylate                     PRESENT      EXPECTED   Metoprolol                     PRESENT      EXPECTED  Drug Present not Declared for Prescription Verification   Diphenhydramine                PRESENT      UNEXPECTED  Drug Absent but Declared for Prescription Verification   Lidocaine                      Not Detected UNEXPECTED    Lidocaine, as indicated in the declared medication list, is not    always detected even when used as directed.  ==================================================================== Test                      Result    Flag   Units      Ref Range   Creatinine              136              mg/dL      >=20 ==================================================================== Declared Medications:  The flagging and interpretation on this report are based on the  following declared medications.  Unexpected results may arise from  inaccuracies in the declared medications.   **Note: The testing scope of this panel includes these medications:   Gabapentin (Neurontin)  Metoprolol (Toprol)   **Note: The testing scope of this panel does not include small to  moderate amounts of these reported medications:   Acetaminophen  Aspirin  Lidocaine   **Note: The testing scope of this panel does not include the  following reported medications:   Atorvastatin (Lipitor)  Chondroitin  Cyanocobalamin  Glucosamine  Omega-3 Fatty Acids  Pantoprazole (Protonix)   Ubiquinone (CoQ10)  Vitamin E ==================================================================== For clinical consultation, please call [phone]. ====================================================================      ROS  Constitutional: Denies any fever or chills Gastrointestinal: No reported hemesis, hematochezia, vomiting, or acute GI distress Musculoskeletal:  low back pain Neurological: No reported episodes of acute onset apraxia, aphasia, dysarthria, agnosia, amnesia, paralysis, loss of coordination, or loss of consciousness  Medication Review  Aspirin-Acetaminophen, Coenzyme Q10, HYDROcodone-acetaminophen, Lidocaine, Omega-3 Fatty Acids, Vitamin E Acetate, atorvastatin, gabapentin, glucosamine-chondroitin, metoprolol succinate, pantoprazole, and vitamin B-12  History Review  Allergy: [name] is allergic to panmycin [tetracycline] and crestor [rosuvastatin calcium]. Drug: [name]  reports no history of drug use. Alcohol:  reports current  alcohol use. Tobacco:  reports that she has quit smoking. She has never used smokeless tobacco. Social: [name]  reports that she has quit smoking. She has never used smokeless tobacco. She reports current alcohol use. She reports that she does not use drugs. Medical:  has a past medical history of Arthritis, Cancer ([place]), GERD (gastroesophageal reflux disease), Hyperlipidemia, Hypertension, Lumbar disc disease, and PMR (polymyalgia rheumatica) ([place]). Surgical: [name]  has a past surgical history that includes Abdominal hysterectomy; Colonoscopy with propofol (N/A, 09/06/2017); and Esophagogastroduodenoscopy (egd) with propofol (N/A, 09/06/2017). Family: family history is not on file.  Laboratory Chemistry Profile   Renal Lab Results  Component Value Date   BUN 12 04/15/2021   CREATININE 0.70 04/15/2021   BCR 17 04/15/2021    Hepatic Lab Results  Component Value Date   AST 20 04/15/2021   ALBUMIN 4.6 04/15/2021    ALKPHOS 72 04/15/2021    Electrolytes Lab Results  Component Value Date   NA 143 04/15/2021   K 4.6 04/15/2021   CL 106 04/15/2021   CALCIUM 9.8 04/15/2021   MG 2.1 04/15/2021    Bone Lab Results  Component Value Date   25OHVITD1 54 04/15/2021   25OHVITD2 <1.0 04/15/2021   25OHVITD3 54 04/15/2021    Inflammation (CRP: Acute Phase) (ESR: Chronic Phase) Lab Results  Component Value Date   CRP 1 04/15/2021   ESRSEDRATE 13 04/15/2021         Note: Above Lab results reviewed.  Recent Imaging Review  DG Lumbar Spine Complete W/Bend CLINICAL DATA:  Low back pain  EXAM: LUMBAR SPINE - COMPLETE WITH BENDING VIEWS  COMPARISON:  X-ray lumbar spine 05/01/2012  FINDINGS: Markedly limited evaluation due to overlapping osseous structures and overlying soft tissues.  Five non-rib-bearing lumbar vertebral bodies. Slight levocurvature of the lumbar spine centered at the L2-L3 level. Grade 1 anterolisthesis of L5 on S1. No change in alignment on flexion and extension views. Multilevel degenerative changes of the spine. There is no evidence of lumbar spine fracture. Alignment is normal. Intervertebral disc spaces are maintained. Aortic calcification.  IMPRESSION: 1. No acute displaced fracture or traumatic listhesis of the lumbar spine. 2. Grade 1 anterolisthesis of L5 on S1 with no change in alignment on extension and flexion views. 3.  Aortic Atherosclerosis (ICD10-I70.0).  Electronically Signed   By: Iven Finn M.D.   On: 04/15/2021 20:28 DG Thoracic Spine W/Swimmers CLINICAL DATA:  Back pain.  EXAM: THORACIC SPINE - 3 VIEWS  COMPARISON:  Chest radiograph dated 05/01/2012.  FINDINGS: No acute fracture or subluxation. The bones are osteopenic. Multilevel degenerative changes and scoliosis. Atherosclerotic calcification of the aorta. The soft tissues are unremarkable.  IMPRESSION: No acute/traumatic thoracic spine pathology.  Electronically Signed   By:  Anner Crete M.D.   On: 04/15/2021 20:22 DG Cervical Spine With Flex & Extend CLINICAL DATA:  Cervicalgia  EXAM: CERVICAL SPINE COMPLETE WITH FLEXION AND EXTENSION VIEWS  COMPARISON:  X-ray cervical spine 01/08/2013  FINDINGS: On the lateral view the cervical spine is visualized to the level of C7. Normal cervical lordosis. On flexion view there is development of grade 1 anterolisthesis of C2 on C3, C3 on C4, C4 on C5.  Dens is well positioned between the lateral masses of C1. There is limited evaluation of the dens for acute fracture on the open-mouth view due to overlying osseous structures.  Diffusely decreased bone density. Mild-to-moderate degenerative change of the spine at the C5 through C7 levels. Osseous neural foraminal stenosis  at the C5-C6 level on the left. No acute displaced fracture is detected.No aggressive-appearing focal osseous lesions.  Pre-vertebral soft tissues are within normal limits.  IMPRESSION: 1. On flexion view there is development of grade 1 anterolisthesis of C2 on C3, C3 on C4, C4 on C5. 2. Mild-to-moderate degenerative changes of the C5 through C7 levels with associated osseous neural foraminal stenosis at the C5-C6 level on the left. 3. No acute displaced fracture or traumatic listhesis of the cervical spine.  Electronically Signed   By: Iven Finn M.D.   On: 04/15/2021 18:46 DG HIP UNILAT W OR W/O PELVIS 2-3 VIEWS RIGHT CLINICAL DATA:  Right hip pain  EXAM: DG HIP (WITH OR WITHOUT PELVIS) 2-3V RIGHT  COMPARISON:  None.  FINDINGS: No acute fracture or dislocation identified in the right hip. Moderate joint space narrowing with acetabular subchondral sclerosis and small marginal osteophyte formation. No acute fracture identified in the pelvis. Bones are osteopenic.  IMPRESSION: Chronic changes with no acute osseous abnormality identified.  Electronically Signed   By: Ofilia Neas M.D.   On: 04/15/2021 16:45 DG  Shoulder Right CLINICAL DATA:  Right shoulder pain  EXAM: RIGHT SHOULDER - 2+ VIEW  COMPARISON:  None.  FINDINGS: No acute fracture or dislocation identified. Mild joint space narrowing. No suspicious bony lesions identified.  IMPRESSION: No acute osseous abnormality identified.  Electronically Signed   By: Ofilia Neas M.D.   On: 04/15/2021 16:44 Note: Reviewed        Physical Exam  General appearance: Well nourished, well developed, and well hydrated. In no apparent acute distress Mental status: Alert, oriented x 3 (person, place, & time)       Respiratory: No evidence of acute respiratory distress Eyes: PERLA Vitals: BP (!) 156/85 (BP Location: Right Arm, Cuff Size: Normal)    Pulse (!) 54    Temp (!) 97.5 F (36.4 C) (Temporal)    Resp 16    Ht '5\' 2"'  (1.575 m)    Wt 116 lb (52.6 kg)    SpO2 99%    BMI 21.22 kg/m  BMI: Estimated body mass index is 21.22 kg/m as calculated from the following:   Height as of this encounter: '5\' 2"'  (1.575 m).   Weight as of this encounter: 116 lb (52.6 kg). Ideal: Ideal body weight: 50.1 kg (110 lb 7.2 oz) Adjusted ideal body weight: 51.1 kg (112 lb 10.7 oz)  Lumbar Spine Area Exam  Skin & Axial Inspection: No masses, redness, or swelling Alignment: Symmetrical Functional ROM: Pain restricted ROM affecting both sides Stability: No instability detected Muscle Tone/Strength: Functionally intact. No obvious neuro-muscular anomalies detected. Sensory (Neurological): Dermatomal pain pattern and MSK   Lower Extremity Exam      Side: Right lower extremity   Side: Left lower extremity  Stability: No instability observed           Stability: No instability observed          Skin & Extremity Inspection: Skin color, temperature, and hair growth are WNL. No peripheral edema or cyanosis. No masses, redness, swelling, asymmetry, or associated skin lesions. No contractures.   Skin & Extremity Inspection: Skin color, temperature, and hair growth are  WNL. No peripheral edema or cyanosis. No masses, redness, swelling, asymmetry, or associated skin lesions. No contractures.  Functional ROM: Pain restricted ROM for hip joint           Functional ROM: Pain restricted ROM for hip joint  Muscle Tone/Strength: Functionally intact. No obvious neuro-muscular anomalies detected.   Muscle Tone/Strength: Functionally intact. No obvious neuro-muscular anomalies detected.  Sensory (Neurological): Musculoskeletal pain pattern         Sensory (Neurological): Musculoskeletal pain pattern        DTR: Patellar: deferred today Achilles: deferred today Plantar: deferred today   DTR: Patellar: deferred today Achilles: deferred today Plantar: deferred today  Palpation: No palpable anomalies   Palpation: No palpable anomalies     Assessment   Status Diagnosis  Controlled Controlled Controlled 1. Chronic pain syndrome   2. Osteoarthritis of spine with radiculopathy, lumbar region   3. Lumbosacral facet arthropathy   4. Chronic low back pain (1ry area of Pain) (Bilateral) (R>L) w/o sciatica   5. Grade 1 Anterolisthesis of lumbosacral spine (L5/S1)   6. Lumbar foraminal stenosis (Right: L2-3)   7. 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 Pain medication agreement signed   9. Encounter for long-term opiate analgesic use   10. DDD (degenerative disc disease), lumbar      Updated Problems: Problem  Pain Medication Agreement Signed  Encounter for Long-Term Opiate Analgesic Use  Chronic Pain Syndrome  Pharmacologic Therapy  Disorder of Skeletal System  Ddd (Degenerative Disc Disease), Lumbar  Osteoarthritis of Spine With Radiculopathy, Lumbar Region  Abnormal MRI, lumbar spine (03/13/2018)   (03/13/2018) LUMBAR MRI FINDINGS: DISC LEVELS: L2-3: Moderate degenerate disc disease with a bulging annulus and mainly right-sided osteophytic ridging. There is bilateral lateral recess stenosis, right greater than left and mild right  foraminal encroachment but no direct neural compression. L4-5: Moderate facet disease and mild bulging annulus but no significant spinal, lateral recess or foraminal stenosis. L5-S1: Mild degenerative anterolisthesis of L5. The spinal canal is generous. No spinal or foraminal stenosis. Moderate facet disease.  IMPRESSION: 1. Moderate degenerate disc disease at L2-3 with a bulging annulus and mainly right-sided osteophytic ridging. Mild bilateral lateral recess stenosis, right greater than left along with mild right foraminal encroachment. 2. No other significant findings.   Grade 1 Anterolisthesis of lumbosacral spine (L5/S1)   (03/13/2018) LUMBAR MRI FINDINGS: LEVELS: L5-S1: Mild degenerative anterolisthesis of L5. The spinal canal is generous. No spinal or foraminal stenosis. Moderate facet disease.   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 There is bilateral lateral recess stenosis, right greater than left and mild right foraminal encroachment but no direct neural compression.   Lumbar foraminal stenosis (Right: L2-3)   (03/13/2018) LUMBAR MRI FINDINGS: LEVELS: L2-3: mild right foraminal encroachment   Chronic Sciatica, Right   Last Assessment & Plan:  Formatting of this note might be different from the original. Lowered gabapentin with diarrhea perisisting and still with diarrhea. Not eating much at night. Leg pain perists     Plan of Care  Problem-specific:  No problem-specific Assessment & Plan notes found for this encounter.  [name] has a current medication list which includes the following long-term medication(s): atorvastatin, gabapentin, metoprolol succinate, and pantoprazole.  Pharmacotherapy (Medications Ordered): Meds ordered this encounter  Medications   HYDROcodone-acetaminophen (NORCO/VICODIN)  5-325 MG tablet    Sig: Take 1-2 tablets by mouth every 12 (twelve) hours as needed for severe pain. Must last 30 days.    Dispense:  75 tablet    Refill:  0    Chronic Pain: STOP Act (Not applicable) Fill 1 day early if closed on  refill date. Avoid benzodiazepines within 8 hours of opioids   HYDROcodone-acetaminophen (NORCO/VICODIN) 5-325 MG tablet    Sig: Take 1-2 tablets by mouth every 12 (twelve) hours as needed for severe pain. Must last 30 days.    Dispense:  75 tablet    Refill:  0    Chronic Pain: STOP Act (Not applicable) Fill 1 day early if closed on refill date. Avoid benzodiazepines within 8 hours of opioids   HYDROcodone-acetaminophen (NORCO/VICODIN) 5-325 MG tablet    Sig: Take 1-2 tablets by mouth every 12 (twelve) hours as needed for severe pain. Must last 30 days.    Dispense:  75 tablet    Refill:  0    Chronic Pain: STOP Act (Not applicable) Fill 1 day early if closed on refill date. Avoid benzodiazepines within 8 hours of opioids    Follow-up plan:   Return in about 3 months (around 09/18/2021) for Medication Management, in person.    Recent Visits Date Type Provider Dept  04/30/21 Office Visit Gillis Santa, MD [facility]  04/15/21 Office Visit Milinda Pointer, MD [facility]  Showing recent visits within past 90 days and meeting all other requirements Today's Visits Date Type Provider Dept  06/18/21 Office Visit Gillis Santa, MD [facility]  Showing today's visits and meeting all other requirements Future Appointments Date Type Provider Dept  09/15/21 Appointment Gillis Santa, MD [facility]  Showing future appointments within next 90 days and meeting all other requirements  I discussed the assessment and treatment plan with the patient. The patient was provided an opportunity to ask questions and all were answered. The patient agreed with the plan and demonstrated an understanding of the instructions.  Patient  advised to call back or seek an in-person evaluation if the symptoms or condition worsens.  Duration of encounter: 65mnutes.  Note by: BGillis Santa MD Date: 06/18/2021; Time: 9:03 AM

## 2021-09-15 ENCOUNTER — Encounter: Payer: Self-pay | Admitting: Student in an Organized Health Care Education/Training Program

## 2021-09-15 ENCOUNTER — Ambulatory Visit
Payer: Medicare Other | Attending: Student in an Organized Health Care Education/Training Program | Admitting: Student in an Organized Health Care Education/Training Program

## 2021-09-15 VITALS — BP 177/49 | HR 89 | Temp 97.2°F | Ht 62.0 in | Wt 116.0 lb

## 2021-09-15 DIAGNOSIS — M47817 Spondylosis without myelopathy or radiculopathy, lumbosacral region: Secondary | ICD-10-CM | POA: Insufficient documentation

## 2021-09-15 DIAGNOSIS — M4317 Spondylolisthesis, lumbosacral region: Secondary | ICD-10-CM | POA: Insufficient documentation

## 2021-09-15 DIAGNOSIS — G8929 Other chronic pain: Secondary | ICD-10-CM | POA: Diagnosis present

## 2021-09-15 DIAGNOSIS — G894 Chronic pain syndrome: Secondary | ICD-10-CM | POA: Diagnosis present

## 2021-09-15 DIAGNOSIS — Z0289 Encounter for other administrative examinations: Secondary | ICD-10-CM | POA: Diagnosis present

## 2021-09-15 DIAGNOSIS — M545 Low back pain, unspecified: Secondary | ICD-10-CM | POA: Insufficient documentation

## 2021-09-15 DIAGNOSIS — M4726 Other spondylosis with radiculopathy, lumbar region: Secondary | ICD-10-CM | POA: Diagnosis present

## 2021-09-15 DIAGNOSIS — M48061 Spinal stenosis, lumbar region without neurogenic claudication: Secondary | ICD-10-CM | POA: Diagnosis not present

## 2021-09-15 MED ORDER — HYDROCODONE-ACETAMINOPHEN 5-325 MG PO TABS: 1.0000 | ORAL_TABLET | Freq: Two times a day (BID) | ORAL | 0 refills | Status: AC | PRN

## 2021-09-15 MED ORDER — HYDROCODONE-ACETAMINOPHEN 5-325 MG PO TABS: 1.0000 | ORAL_TABLET | Freq: Two times a day (BID) | ORAL | 0 refills | Status: DC | PRN

## 2021-09-15 NOTE — Progress Notes (Signed)
Nursing Pain Medication Assessment:  Safety precautions to be maintained throughout the outpatient stay will include: orient to surroundings, keep bed in low position, maintain call bell within reach at all times, provide assistance with transfer out of bed and ambulation.  Medication Inspection Compliance: Pill count conducted under aseptic conditions, in front of the patient. Neither the pills nor the bottle was removed from the patient's sight at any time. Once count was completed pills were immediately returned to the patient in their original bottle.  Medication: See above Pill/Patch Count:  34 of 75 pills remain Pill/Patch Appearance: Markings consistent with prescribed medication Bottle Appearance: Standard pharmacy container. Clearly labeled. Filled Date: 5 / 20 / 2023 Last Medication intake:  TodaySafety precautions to be maintained throughout the outpatient stay will include: orient to surroundings, keep bed in low position, maintain call bell within reach at all times, provide assistance with transfer out of bed and ambulation.

## 2021-09-15 NOTE — Progress Notes (Signed)
PROVIDER NOTE: Information contained herein reflects review and annotations entered in association with encounter. Interpretation of such information and data should be left to medically-trained personnel. Information provided to patient can be located elsewhere in the medical record under "Patient Instructions". Document created using STT-dictation technology, any transcriptional errors that may result from process are unintentional.    Patient: Caroline Campbell  Service Category: E/M  Provider: Gillis Santa, MD  DOB: 03/28/1936  DOS: 09/15/2021  Specialty: Interventional Pain Management  MRN: [id number]  Setting: Ambulatory outpatient  PCP: Kirk Ruths, MD  Type: Established Patient    Referring Provider: Kirk Ruths, MD  Location: Office  Delivery: Face-to-face     HPI  [name], a 86 y.o. year old female, is here today because of her Chronic pain syndrome [G89.4]. [name] primary complain today is Back Pain (lower) Last encounter: My last encounter with her was on 06/18/21 Pertinent problems: 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 Pain Assessment: Severity of Chronic pain is reported as a 6 /10. Location: Back  /pain radiaites down to her right hip. Onset: More than a month ago. Quality: Constant, Aching, Dull, Stabbing. Timing: Constant. Modifying factor(s): Meds. Vitals:  height is '5\' 2"'  (1.575 m) and weight is 116 lb (52.6 kg). Her temperature is 97.2 F (36.2 C) (abnormal). Her blood pressure is  177/49 (abnormal) and her pulse is 89. Her oxygen saturation is 99%.   Reason for encounter: medication management.   No change in medical history since last visit.  Patient's pain is at baseline.  Patient continues multimodal pain regimen as prescribed.  States that it provides pain relief and improvement in functional status. Finds it relaxing working in her yard.  She has chickens as well as a dog and does a Caroline Campbell of outdoor work.  Pharmacotherapy Assessment  Analgesic: 5 mg every 12 hours as needed with an extra 15 tablets/month to take for breakthrough pain on evenings when her pain is higher   Monitoring: [place] PMP: PDMP reviewed during this encounter.       Pharmacotherapy: No side-effects or adverse reactions reported. Compliance: No problems identified. Effectiveness: Clinically acceptable.  Chauncey Fischer, RN  09/15/2021  9:05 AM  Sign when Signing Visit Nursing Pain Medication Assessment:  Safety precautions to be maintained throughout the outpatient stay will include: orient to surroundings, keep bed in low position, maintain call bell within reach at all times, provide assistance with transfer out of bed and ambulation.  Medication Inspection Compliance: Pill count conducted under aseptic conditions, in front of the patient. Neither the pills nor the bottle was removed from the patient's sight at any time. Once count was completed pills were immediately returned to the patient in their original bottle.  Medication: See above Pill/Patch Count:  34 of 75 pills remain Pill/Patch Appearance: Markings consistent with prescribed medication Bottle Appearance: Standard pharmacy container. Clearly labeled. Filled Date: 5 / 20 / 2023 Last Medication intake:  TodaySafety precautions to be maintained throughout the outpatient stay will include: orient to surroundings, keep bed in low position, maintain call bell within reach at all times, provide assistance with transfer  out of bed and  ambulation.    UDS:  Summary  Date Value Ref Range Status  04/15/2021 Note  Final    Comment:    ==================================================================== Compliance Drug Analysis, Ur ==================================================================== Test                             Result       Flag       Units  Drug Present and Declared for Prescription Verification   Gabapentin                     PRESENT      EXPECTED   Acetaminophen                  PRESENT      EXPECTED   Salicylate                     PRESENT      EXPECTED   Metoprolol                     PRESENT      EXPECTED  Drug Present not Declared for Prescription Verification   Diphenhydramine                PRESENT      UNEXPECTED  Drug Absent but Declared for Prescription Verification   Lidocaine                      Not Detected UNEXPECTED    Lidocaine, as indicated in the declared medication list, is not    always detected even when used as directed.  ==================================================================== Test                      Result    Flag   Units      Ref Range   Creatinine              136              mg/dL      >=20 ==================================================================== Declared Medications:  The flagging and interpretation on this report are based on the  following declared medications.  Unexpected results may arise from  inaccuracies in the declared medications.   **Note: The testing scope of this panel includes these medications:   Gabapentin (Neurontin)  Metoprolol (Toprol)   **Note: The testing scope of this panel does not include small to  moderate amounts of these reported medications:   Acetaminophen  Aspirin  Lidocaine   **Note: The testing scope of this panel does not include the  following reported medications:   Atorvastatin (Lipitor)  Chondroitin  Cyanocobalamin  Glucosamine  Omega-3 Fatty Acids  Pantoprazole (Protonix)  Ubiquinone  (CoQ10)  Vitamin E ==================================================================== For clinical consultation, please call [phone]. ====================================================================      ROS  Constitutional: Denies any fever or chills Gastrointestinal: No reported hemesis, hematochezia, vomiting, or acute GI distress Musculoskeletal:  low back pain Neurological: No reported episodes of acute onset apraxia, aphasia, dysarthria, agnosia, amnesia, paralysis, loss of coordination, or loss of consciousness  Medication Review  Aspirin-Acetaminophen, Coenzyme Q10, HYDROcodone-acetaminophen, Omega-3 Fatty Acids, Vitamin E Acetate, atorvastatin, gabapentin, glucosamine-chondroitin, lidocaine, metoprolol succinate, pantoprazole, and vitamin B-12  History Review  Allergy: Ms. Pola is allergic to panmycin [tetracycline] and crestor [rosuvastatin calcium]. Drug: Ms. Diloreto  reports no history of drug use. Alcohol:  reports current alcohol use. Tobacco:  reports that she has quit smoking. She has never used smokeless tobacco. Social: Ms. Sevcik  reports that she has quit smoking. She has never used smokeless tobacco. She reports current alcohol use. She reports that she does not use drugs. Medical:  has a past medical history of Arthritis, Cancer ([place]), GERD (gastroesophageal reflux disease), Hyperlipidemia, Hypertension, Lumbar disc disease, and PMR (polymyalgia rheumatica) ([place]). Surgical: Ms. Ford  has a past surgical history that includes Abdominal hysterectomy; Colonoscopy with propofol (N/A, 09/06/2017); and Esophagogastroduodenoscopy (egd) with propofol (N/A, 09/06/2017). Family: family history is not on file.  Laboratory Chemistry Profile   Renal Lab Results  Component Value Date   BUN 12 04/15/2021   CREATININE 0.70 04/15/2021   BCR 17 04/15/2021    Hepatic Lab Results  Component Value Date   AST 20 04/15/2021   ALBUMIN 4.6 04/15/2021   ALKPHOS  72 04/15/2021    Electrolytes Lab Results  Component Value Date   NA 143 04/15/2021   K 4.6 04/15/2021   CL 106 04/15/2021   CALCIUM 9.8 04/15/2021   MG 2.1 04/15/2021    Bone Lab Results  Component Value Date   25OHVITD1 54 04/15/2021   25OHVITD2 <1.0 04/15/2021   25OHVITD3 54 04/15/2021    Inflammation (CRP: Acute Phase) (ESR: Chronic Phase) Lab Results  Component Value Date   CRP 1 04/15/2021   ESRSEDRATE 13 04/15/2021         Note: Above Lab results reviewed.  Recent Imaging Review  DG Lumbar Spine Complete W/Bend CLINICAL DATA:  Low back pain  EXAM: LUMBAR SPINE - COMPLETE WITH BENDING VIEWS  COMPARISON:  X-ray lumbar spine 05/01/2012  FINDINGS: Markedly limited evaluation due to overlapping osseous structures and overlying soft tissues.  Five non-rib-bearing lumbar vertebral bodies. Slight levocurvature of the lumbar spine centered at the L2-L3 level. Grade 1 anterolisthesis of L5 on S1. No change in alignment on flexion and extension views. Multilevel degenerative changes of the spine. There is no evidence of lumbar spine fracture. Alignment is normal. Intervertebral disc spaces are maintained. Aortic calcification.  IMPRESSION: 1. No acute displaced fracture or traumatic listhesis of the lumbar spine. 2. Grade 1 anterolisthesis of L5 on S1 with no change in alignment on extension and flexion views. 3.  Aortic Atherosclerosis (ICD10-I70.0).  Electronically Signed   By: Iven Finn M.D.   On: 04/15/2021 20:28 DG Thoracic Spine W/Swimmers CLINICAL DATA:  Back pain.  EXAM: THORACIC SPINE - 3 VIEWS  COMPARISON:  Chest radiograph dated 05/01/2012.  FINDINGS: No acute fracture or subluxation. The bones are osteopenic. Multilevel degenerative changes and scoliosis. Atherosclerotic calcification of the aorta. The soft tissues are unremarkable.  IMPRESSION: No acute/traumatic thoracic spine pathology.  Electronically Signed   By: Anner Crete M.D.   On: 04/15/2021 20:22 DG Cervical Spine With Flex & Extend CLINICAL DATA:  Cervicalgia  EXAM: CERVICAL SPINE COMPLETE WITH FLEXION AND EXTENSION VIEWS  COMPARISON:  X-ray cervical spine 01/08/2013  FINDINGS: On the lateral view the cervical spine is visualized to the level of C7. Normal cervical lordosis. On flexion view there is development of grade 1 anterolisthesis of C2 on C3, C3 on C4, C4 on C5.  Dens is well positioned between the lateral masses of C1. There is limited evaluation of the dens for acute fracture on the open-mouth view due to overlying osseous structures.  Diffusely decreased bone density. Mild-to-moderate degenerative change of the spine at the C5 through C7 levels. Osseous neural  foraminal stenosis at the C5-C6 level on the left. No acute displaced fracture is detected.No aggressive-appearing focal osseous lesions.  Pre-vertebral soft tissues are within normal limits.  IMPRESSION: 1. On flexion view there is development of grade 1 anterolisthesis of C2 on C3, C3 on C4, C4 on C5. 2. Mild-to-moderate degenerative changes of the C5 through C7 levels with associated osseous neural foraminal stenosis at the C5-C6 level on the left. 3. No acute displaced fracture or traumatic listhesis of the cervical spine.  Electronically Signed   By: Iven Finn M.D.   On: 04/15/2021 18:46 DG HIP UNILAT W OR W/O PELVIS 2-3 VIEWS RIGHT CLINICAL DATA:  Right hip pain  EXAM: DG HIP (WITH OR WITHOUT PELVIS) 2-3V RIGHT  COMPARISON:  None.  FINDINGS: No acute fracture or dislocation identified in the right hip. Moderate joint space narrowing with acetabular subchondral sclerosis and small marginal osteophyte formation. No acute fracture identified in the pelvis. Bones are osteopenic.  IMPRESSION: Chronic changes with no acute osseous abnormality identified.  Electronically Signed   By: Ofilia Neas M.D.   On: 04/15/2021 16:45 DG Shoulder  Right CLINICAL DATA:  Right shoulder pain  EXAM: RIGHT SHOULDER - 2+ VIEW  COMPARISON:  None.  FINDINGS: No acute fracture or dislocation identified. Mild joint space narrowing. No suspicious bony lesions identified.  IMPRESSION: No acute osseous abnormality identified.  Electronically Signed   By: Ofilia Neas M.D.   On: 04/15/2021 16:44 Note: Reviewed        Physical Exam  General appearance: Well nourished, well developed, and well hydrated. In no apparent acute distress Mental status: Alert, oriented x 3 (person, place, & time)       Respiratory: No evidence of acute respiratory distress Eyes: PERLA Vitals: BP (!) 177/49   Pulse 89   Temp (!) 97.2 F (36.2 C)   Ht '5\' 2"'  (1.575 m)   Wt 116 lb (52.6 kg)   SpO2 99%   BMI 21.22 kg/m  BMI: Estimated body mass index is 21.22 kg/m as calculated from the following:   Height as of this encounter: '5\' 2"'  (1.575 m).   Weight as of this encounter: 116 lb (52.6 kg). Ideal: Ideal body weight: 50.1 kg (110 lb 7.2 oz) Adjusted ideal body weight: 51.1 kg (112 lb 10.7 oz)  Lumbar Spine Area Exam  Skin & Axial Inspection: No masses, redness, or swelling Alignment: Symmetrical Functional ROM: Pain restricted ROM affecting both sides Stability: No instability detected Muscle Tone/Strength: Functionally intact. No obvious neuro-muscular anomalies detected. Sensory (Neurological): Dermatomal pain pattern and MSK   Lower Extremity Exam      Side: Right lower extremity   Side: Left lower extremity  Stability: No instability observed           Stability: No instability observed          Skin & Extremity Inspection: Skin color, temperature, and hair growth are WNL. No peripheral edema or cyanosis. No masses, redness, swelling, asymmetry, or associated skin lesions. No contractures.   Skin & Extremity Inspection: Skin color, temperature, and hair growth are WNL. No peripheral edema or cyanosis. No masses, redness, swelling, asymmetry,  or associated skin lesions. No contractures.  Functional ROM: Pain restricted ROM for hip joint           Functional ROM: Pain restricted ROM for hip joint          Muscle Tone/Strength: Functionally intact. No obvious neuro-muscular anomalies detected.   Muscle Tone/Strength: Functionally intact. No  obvious neuro-muscular anomalies detected.  Sensory (Neurological): Musculoskeletal pain pattern         Sensory (Neurological): Musculoskeletal pain pattern        DTR: Patellar: deferred today Achilles: deferred today Plantar: deferred today   DTR: Patellar: deferred today Achilles: deferred today Plantar: deferred today  Palpation: No palpable anomalies   Palpation: No palpable anomalies     Assessment   Status Diagnosis  Controlled Controlled Controlled 1. Chronic pain syndrome   2. Osteoarthritis of spine with radiculopathy, lumbar region   3. 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 Lumbosacral facet arthropathy   5. Chronic low back pain (1ry area of Pain) (Bilateral) (R>L) w/o sciatica   6. Grade 1 Anterolisthesis of lumbosacral spine (L5/S1)   7. 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 Pain medication agreement signed       Plan of Care  Problem-specific:  No problem-specific Assessment & Plan notes found for this encounter.  Ms. KINDSEY EBLIN has a current medication list which includes the following long-term medication(s): atorvastatin, gabapentin, metoprolol succinate, and pantoprazole.  Pharmacotherapy (Medications Ordered): Meds ordered this encounter  Medications   HYDROcodone-acetaminophen (NORCO/VICODIN) 5-325 MG tablet    Sig: Take 1-2 tablets by mouth every 12 (twelve) hours as needed for severe pain. Must last 30 days.    Dispense:  75 tablet    Refill:  0    Chronic Pain: STOP Act (Not applicable) Fill 1 day early if closed on refill date. Avoid benzodiazepines within 8 hours of opioids   HYDROcodone-acetaminophen (NORCO/VICODIN)  5-325 MG tablet    Sig: Take 1-2 tablets by mouth every 12 (twelve) hours as needed for severe pain. Must last 30 days.    Dispense:  75 tablet    Refill:  0    Chronic Pain: STOP Act (Not applicable) Fill 1 day early if closed on refill date. Avoid benzodiazepines within 8 hours of opioids   HYDROcodone-acetaminophen (NORCO/VICODIN) 5-325 MG tablet    Sig: Take 1-2 tablets by mouth every 12 (twelve) hours as needed for severe pain. Must last 30 days.    Dispense:  75 tablet    Refill:  0    Chronic Pain: STOP Act (Not applicable) Fill 1 day early if closed on refill date. Avoid benzodiazepines within 8 hours of opioids   Continue gabapentin 300 mg nightly, lidocaine patch as needed, managed by PCP. UDS up-to-date and appropriate.  Follow-up plan:   Return in about 3 months (around 12/16/2021) for Medication Management, in person.    Recent Visits Date Type Provider Dept  06/18/21 Office Visit Gillis Santa, MD [facility]  Showing recent visits within past 90 days and meeting all other requirements Today's Visits Date Type Provider Dept  09/15/21 Office Visit Gillis Santa, MD [facility]  Showing today's visits and meeting all other requirements Future Appointments No visits were found meeting these conditions. Showing future appointments within next 90 days and meeting all other requirements  I discussed the assessment and treatment plan with the patient. The patient was provided an opportunity to ask questions and all were answered. The patient agreed with the plan and demonstrated an understanding of the instructions.  Patient advised to call back or seek an in-person evaluation if the symptoms or condition worsens.  Duration of encounter: 26mnutes.  Note by: BGillis Santa MD Date: 09/15/2021; Time: 9:27 AM

## 2021-12-08 ENCOUNTER — Ambulatory Visit
Payer: Medicare Other | Attending: Student in an Organized Health Care Education/Training Program | Admitting: Student in an Organized Health Care Education/Training Program

## 2021-12-08 ENCOUNTER — Encounter: Payer: Self-pay | Admitting: Student in an Organized Health Care Education/Training Program

## 2021-12-08 VITALS — BP 148/63 | HR 60 | Temp 96.9°F | Resp 18 | Ht 62.0 in | Wt 114.0 lb

## 2021-12-08 DIAGNOSIS — M48061 Spinal stenosis, lumbar region without neurogenic claudication: Secondary | ICD-10-CM

## 2021-12-08 DIAGNOSIS — M545 Low back pain, unspecified: Secondary | ICD-10-CM

## 2021-12-08 DIAGNOSIS — G894 Chronic pain syndrome: Secondary | ICD-10-CM

## 2021-12-08 DIAGNOSIS — M4726 Other spondylosis with radiculopathy, lumbar region: Secondary | ICD-10-CM

## 2021-12-08 DIAGNOSIS — G8929 Other chronic pain: Secondary | ICD-10-CM

## 2021-12-08 DIAGNOSIS — M4317 Spondylolisthesis, lumbosacral region: Secondary | ICD-10-CM | POA: Diagnosis present

## 2021-12-08 DIAGNOSIS — M47817 Spondylosis without myelopathy or radiculopathy, lumbosacral region: Secondary | ICD-10-CM

## 2021-12-08 MED ORDER — HYDROCODONE-ACETAMINOPHEN 5-325 MG PO TABS: 1.0000 | ORAL_TABLET | Freq: Two times a day (BID) | ORAL | 0 refills | Status: DC | PRN

## 2021-12-08 MED ORDER — HYDROCODONE-ACETAMINOPHEN 5-325 MG PO TABS: 1.0000 | ORAL_TABLET | Freq: Two times a day (BID) | ORAL | 0 refills | Status: AC | PRN

## 2021-12-08 NOTE — Progress Notes (Signed)
Nursing Pain Medication Assessment:  Safety precautions to be maintained throughout the outpatient stay will include: orient to surroundings, keep bed in low position, maintain call bell within reach at all times, provide assistance with transfer out of bed and ambulation.  Medication Inspection Compliance: Pill count conducted under aseptic conditions, in front of the patient. Neither the pills nor the bottle was removed from the patient's sight at any time. Once count was completed pills were immediately returned to the patient in their original bottle.  Medication: Hydrocodone/APAP Pill/Patch Count:  52 of 75 pills remain Pill/Patch Appearance: Markings consistent with prescribed medication Bottle Appearance: Standard pharmacy container. Clearly labeled. Filled Date: 08 / 19 / 2023 Last Medication intake:  Today

## 2021-12-08 NOTE — Progress Notes (Signed)
PROVIDER NOTE: Information contained herein reflects review and annotations entered in association with encounter. Interpretation of such information and data should be left to medically-trained personnel. Information provided to patient can be located elsewhere in the medical record under "Patient Instructions". Document created using STT-dictation technology, any transcriptional errors that may result from process are unintentional.    Patient: Caroline Campbell  Service Category: E/M  Provider: Gillis Santa, MD  DOB: 1935/05/28  DOS: 12/08/2021  Specialty: Interventional Pain Management  MRN: [id number]  Setting: Ambulatory outpatient  PCP: Kirk Ruths, MD  Type: Established Patient    Referring Provider: Kirk Ruths, MD  Location: Office  Delivery: Face-to-face     HPI  [name], a 86 y.o. year old female, is here today because of her Chronic pain syndrome [G89.4]. [name] primary complain today is Back Pain Last encounter: My last encounter with her was on 06/18/21 Pertinent problems: 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 Pain Assessment: Severity of Chronic pain is reported as a 5 /10. Location: Back Lower/radiates to right hip. Onset: More than a month ago. Quality: Aching. Timing: Constant. Modifying factor(s): meds. Vitals:  height is '5\' 2"'  (1.575 m) and weight is 114 lb (51.7 kg). Her temperature is 96.9 F (36.1 C) (abnormal). Her blood pressure is 148/63 (abnormal) and her pulse is 60. Her  respiration is 18 and oxygen saturation is 100%.   Reason for encounter: medication management.   No change in medical history since last visit.  Patient's pain is at baseline.  Patient continues multimodal pain regimen as prescribed.      Pharmacotherapy Assessment  Analgesic: 5 mg every 12 hours as needed with an extra 15 tablets/month to take for breakthrough pain on evenings when her pain is higher   Monitoring: [place] PMP: PDMP reviewed during this encounter.       Pharmacotherapy: No side-effects or adverse reactions reported. Compliance: No problems identified. Effectiveness: Clinically acceptable.  Dewayne Shorter, RN  12/08/2021  9:23 AM  Sign when Signing Visit Nursing Pain Medication Assessment:  Safety precautions to be maintained throughout the outpatient stay will include: orient to surroundings, keep bed in low position, maintain call bell within reach at all times, provide assistance with transfer out of bed and ambulation.  Medication Inspection Compliance: Pill count conducted under aseptic conditions, in front of the patient. Neither the pills nor the bottle was removed from the patient's sight at any time. Once count was completed pills were immediately returned to the patient in their original bottle.  Medication: Hydrocodone/APAP Pill/Patch Count:  52 of 75 pills remain Pill/Patch Appearance: Markings consistent with prescribed medication Bottle Appearance: Standard pharmacy container. Clearly labeled. Filled Date: 08 / 19 / 2023 Last Medication intake:  Today     UDS:  Summary  Date Value Ref Range Status  04/15/2021 Note  Final    Comment:    ==================================================================== Compliance Drug Analysis, Ur ==================================================================== Test                             Result  Flag       Units  Drug Present and Declared for Prescription Verification   Gabapentin                      PRESENT      EXPECTED   Acetaminophen                  PRESENT      EXPECTED   Salicylate                     PRESENT      EXPECTED   Metoprolol                     PRESENT      EXPECTED  Drug Present not Declared for Prescription Verification   Diphenhydramine                PRESENT      UNEXPECTED  Drug Absent but Declared for Prescription Verification   Lidocaine                      Not Detected UNEXPECTED    Lidocaine, as indicated in the declared medication list, is not    always detected even when used as directed.  ==================================================================== Test                      Result    Flag   Units      Ref Range   Creatinine              136              mg/dL      >=20 ==================================================================== Declared Medications:  The flagging and interpretation on this report are based on the  following declared medications.  Unexpected results may arise from  inaccuracies in the declared medications.   **Note: The testing scope of this panel includes these medications:   Gabapentin (Neurontin)  Metoprolol (Toprol)   **Note: The testing scope of this panel does not include small to  moderate amounts of these reported medications:   Acetaminophen  Aspirin  Lidocaine   **Note: The testing scope of this panel does not include the  following reported medications:   Atorvastatin (Lipitor)  Chondroitin  Cyanocobalamin  Glucosamine  Omega-3 Fatty Acids  Pantoprazole (Protonix)  Ubiquinone (CoQ10)  Vitamin E ==================================================================== For clinical consultation, please call [phone]. ====================================================================      ROS  Constitutional: Denies any fever or chills Gastrointestinal: No reported hemesis, hematochezia, vomiting, or acute GI distress Musculoskeletal:  low back pain Neurological: No reported episodes  of acute onset apraxia, aphasia, dysarthria, agnosia, amnesia, paralysis, loss of coordination, or loss of consciousness  Medication Review  Aspirin-Acetaminophen, Coenzyme Q10, HYDROcodone-acetaminophen, Omega-3 Fatty Acids, Vitamin E Acetate, atorvastatin, gabapentin, glucosamine-chondroitin, lidocaine, metoprolol succinate, pantoprazole, and vitamin B-12  History Review  Allergy: Ms. Klas is allergic to panmycin [tetracycline], crestor [rosuvastatin calcium], and dexlansoprazole. Drug: Ms. Wiesen  reports no history of drug use. Alcohol:  reports current alcohol use. Tobacco:  reports that she has quit smoking. She has never used smokeless tobacco. Social: Ms. Georgi  reports that she has quit smoking. She has never used smokeless tobacco. She reports current alcohol use. She reports that she does not use drugs. Medical:  has a past medical history of Arthritis, Cancer ([place]), GERD (gastroesophageal reflux disease), Hyperlipidemia, Hypertension, Lumbar disc disease, and PMR (polymyalgia  rheumatica) ([place]). Surgical: Ms. Anzaldo  has a past surgical history that includes Abdominal hysterectomy; Colonoscopy with propofol (N/A, 09/06/2017); and Esophagogastroduodenoscopy (egd) with propofol (N/A, 09/06/2017). Family: family history is not on file.  Laboratory Chemistry Profile   Renal Lab Results  Component Value Date   BUN 12 04/15/2021   CREATININE 0.70 04/15/2021   BCR 17 04/15/2021    Hepatic Lab Results  Component Value Date   AST 20 04/15/2021   ALBUMIN 4.6 04/15/2021   ALKPHOS 72 04/15/2021    Electrolytes Lab Results  Component Value Date   NA 143 04/15/2021   K 4.6 04/15/2021   CL 106 04/15/2021   CALCIUM 9.8 04/15/2021   MG 2.1 04/15/2021    Bone Lab Results  Component Value Date   25OHVITD1 54 04/15/2021   25OHVITD2 <1.0 04/15/2021   25OHVITD3 54 04/15/2021    Inflammation (CRP: Acute Phase) (ESR: Chronic Phase) Lab Results  Component Value Date   CRP 1  04/15/2021   ESRSEDRATE 13 04/15/2021         Note: Above Lab results reviewed.  Recent Imaging Review  DG Lumbar Spine Complete W/Bend CLINICAL DATA:  Low back pain  EXAM: LUMBAR SPINE - COMPLETE WITH BENDING VIEWS  COMPARISON:  X-ray lumbar spine 05/01/2012  FINDINGS: Markedly limited evaluation due to overlapping osseous structures and overlying soft tissues.  Five non-rib-bearing lumbar vertebral bodies. Slight levocurvature of the lumbar spine centered at the L2-L3 level. Grade 1 anterolisthesis of L5 on S1. No change in alignment on flexion and extension views. Multilevel degenerative changes of the spine. There is no evidence of lumbar spine fracture. Alignment is normal. Intervertebral disc spaces are maintained. Aortic calcification.  IMPRESSION: 1. No acute displaced fracture or traumatic listhesis of the lumbar spine. 2. Grade 1 anterolisthesis of L5 on S1 with no change in alignment on extension and flexion views. 3.  Aortic Atherosclerosis (ICD10-I70.0).  Electronically Signed   By: Iven Finn M.D.   On: 04/15/2021 20:28 DG Thoracic Spine W/Swimmers CLINICAL DATA:  Back pain.  EXAM: THORACIC SPINE - 3 VIEWS  COMPARISON:  Chest radiograph dated 05/01/2012.  FINDINGS: No acute fracture or subluxation. The bones are osteopenic. Multilevel degenerative changes and scoliosis. Atherosclerotic calcification of the aorta. The soft tissues are unremarkable.  IMPRESSION: No acute/traumatic thoracic spine pathology.  Electronically Signed   By: Anner Crete M.D.   On: 04/15/2021 20:22 DG Cervical Spine With Flex & Extend CLINICAL DATA:  Cervicalgia  EXAM: CERVICAL SPINE COMPLETE WITH FLEXION AND EXTENSION VIEWS  COMPARISON:  X-ray cervical spine 01/08/2013  FINDINGS: On the lateral view the cervical spine is visualized to the level of C7. Normal cervical lordosis. On flexion view there is development of grade 1 anterolisthesis of C2 on C3,  C3 on C4, C4 on C5.  Dens is well positioned between the lateral masses of C1. There is limited evaluation of the dens for acute fracture on the open-mouth view due to overlying osseous structures.  Diffusely decreased bone density. Mild-to-moderate degenerative change of the spine at the C5 through C7 levels. Osseous neural foraminal stenosis at the C5-C6 level on the left. No acute displaced fracture is detected.No aggressive-appearing focal osseous lesions.  Pre-vertebral soft tissues are within normal limits.  IMPRESSION: 1. On flexion view there is development of grade 1 anterolisthesis of C2 on C3, C3 on C4, C4 on C5. 2. Mild-to-moderate degenerative changes of the C5 through C7 levels with associated osseous neural foraminal stenosis at the C5-C6 level  on the left. 3. No acute displaced fracture or traumatic listhesis of the cervical spine.  Electronically Signed   By: Iven Finn M.D.   On: 04/15/2021 18:46 DG HIP UNILAT W OR W/O PELVIS 2-3 VIEWS RIGHT CLINICAL DATA:  Right hip pain  EXAM: DG HIP (WITH OR WITHOUT PELVIS) 2-3V RIGHT  COMPARISON:  None.  FINDINGS: No acute fracture or dislocation identified in the right hip. Moderate joint space narrowing with acetabular subchondral sclerosis and small marginal osteophyte formation. No acute fracture identified in the pelvis. Bones are osteopenic.  IMPRESSION: Chronic changes with no acute osseous abnormality identified.  Electronically Signed   By: Ofilia Neas M.D.   On: 04/15/2021 16:45 DG Shoulder Right CLINICAL DATA:  Right shoulder pain  EXAM: RIGHT SHOULDER - 2+ VIEW  COMPARISON:  None.  FINDINGS: No acute fracture or dislocation identified. Mild joint space narrowing. No suspicious bony lesions identified.  IMPRESSION: No acute osseous abnormality identified.  Electronically Signed   By: Ofilia Neas M.D.   On: 04/15/2021 16:44 Note: Reviewed        Physical Exam  General  appearance: Well nourished, well developed, and well hydrated. In no apparent acute distress Mental status: Alert, oriented x 3 (person, place, & time)       Respiratory: No evidence of acute respiratory distress Eyes: PERLA Vitals: BP (!) 148/63   Pulse 60   Temp (!) 96.9 F (36.1 C)   Resp 18   Ht '5\' 2"'  (1.575 m)   Wt 114 lb (51.7 kg)   SpO2 100%   BMI 20.85 kg/m  BMI: Estimated body mass index is 20.85 kg/m as calculated from the following:   Height as of this encounter: '5\' 2"'  (1.575 m).   Weight as of this encounter: 114 lb (51.7 kg). Ideal: Ideal body weight: 50.1 kg (110 lb 7.2 oz) Adjusted ideal body weight: 50.7 kg (111 lb 13.9 oz)  Lumbar Spine Area Exam  Skin & Axial Inspection: No masses, redness, or swelling Alignment: Symmetrical Functional ROM: Pain restricted ROM affecting both sides Stability: No instability detected Muscle Tone/Strength: Functionally intact. No obvious neuro-muscular anomalies detected. Sensory (Neurological): Dermatomal pain pattern and MSK   Lower Extremity Exam      Side: Right lower extremity   Side: Left lower extremity  Stability: No instability observed           Stability: No instability observed          Skin & Extremity Inspection: Skin color, temperature, and hair growth are WNL. No peripheral edema or cyanosis. No masses, redness, swelling, asymmetry, or associated skin lesions. No contractures.   Skin & Extremity Inspection: Skin color, temperature, and hair growth are WNL. No peripheral edema or cyanosis. No masses, redness, swelling, asymmetry, or associated skin lesions. No contractures.  Functional ROM: Pain restricted ROM for hip joint           Functional ROM: Pain restricted ROM for hip joint          Muscle Tone/Strength: Functionally intact. No obvious neuro-muscular anomalies detected.   Muscle Tone/Strength: Functionally intact. No obvious neuro-muscular anomalies detected.  Sensory (Neurological): Musculoskeletal pain  pattern         Sensory (Neurological): Musculoskeletal pain pattern        DTR: Patellar: deferred today Achilles: deferred today Plantar: deferred today   DTR: Patellar: deferred today Achilles: deferred today Plantar: deferred today  Palpation: No palpable anomalies   Palpation: No palpable anomalies  Assessment   Status Diagnosis  Controlled Controlled Controlled 1. Chronic pain syndrome   2. Osteoarthritis of spine with radiculopathy, lumbar region   3. 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 Lumbosacral facet arthropathy   5. Chronic low back pain (1ry area of Pain) (Bilateral) (R>L) w/o sciatica   6. Grade 1 Anterolisthesis of lumbosacral spine (L5/S1)   7. 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  Ms. VINIA JEMMOTT has a current medication list which includes the following long-term medication(s): atorvastatin, gabapentin, metoprolol succinate, and pantoprazole.  Pharmacotherapy (Medications Ordered): Meds ordered this encounter  Medications   HYDROcodone-acetaminophen (NORCO/VICODIN) 5-325 MG tablet    Sig: Take 1-2 tablets by mouth every 12 (twelve) hours as needed for severe pain. Must last 30 days.    Dispense:  75 tablet    Refill:  0    Chronic Pain: STOP Act (Not applicable) Fill 1 day early if closed on refill date. Avoid benzodiazepines within 8 hours of opioids   HYDROcodone-acetaminophen (NORCO/VICODIN) 5-325 MG tablet    Sig: Take 1-2 tablets by mouth every 12 (twelve) hours as needed for severe pain. Must last 30 days.    Dispense:  75 tablet    Refill:  0    Chronic Pain: STOP Act (Not applicable) Fill 1 day early if closed on refill date. Avoid benzodiazepines within 8 hours of opioids   HYDROcodone-acetaminophen (NORCO/VICODIN) 5-325 MG tablet    Sig: Take 1-2 tablets by mouth every 12 (twelve) hours as needed for severe  pain. Must last 30 days.    Dispense:  75 tablet    Refill:  0    Chronic Pain: STOP Act (Not applicable) Fill 1 day early if closed on refill date. Avoid benzodiazepines within 8 hours of opioids   Continue gabapentin 300 mg nightly, lidocaine patch as needed, managed by PCP. UDS up-to-date and appropriate.  Follow-up plan:   Return in about 3 months (around 03/10/2022) for Medication Management, in person.    Recent Visits Date Type Provider Dept  09/15/21 Office Visit Gillis Santa, MD [facility]  Showing recent visits within past 90 days and meeting all other requirements Today's Visits Date Type Provider Dept  12/08/21 Office Visit Gillis Santa, MD [facility]  Showing today's visits and meeting all other requirements Future Appointments Date Type Provider Dept  03/02/22 Appointment Gillis Santa, MD [facility]  Showing future appointments within next 90 days and meeting all other requirements  I discussed the assessment and treatment plan with the patient. The patient was provided an opportunity to ask questions and all were answered. The patient agreed with the plan and demonstrated an understanding of the instructions.  Patient advised to call back or seek an in-person evaluation if the symptoms or condition worsens.  Duration of encounter: 12mnutes.  Note by: BGillis Santa MD Date: 12/08/2021; Time: 9:44 AM

## 2022-03-02 ENCOUNTER — Ambulatory Visit
Payer: Medicare Other | Attending: Student in an Organized Health Care Education/Training Program | Admitting: Student in an Organized Health Care Education/Training Program

## 2022-03-02 ENCOUNTER — Encounter: Payer: Self-pay | Admitting: Student in an Organized Health Care Education/Training Program

## 2022-03-02 VITALS — BP 182/70 | HR 62 | Temp 97.3°F | Ht 61.0 in | Wt 112.0 lb

## 2022-03-02 DIAGNOSIS — M4317 Spondylolisthesis, lumbosacral region: Secondary | ICD-10-CM | POA: Insufficient documentation

## 2022-03-02 DIAGNOSIS — M545 Low back pain, unspecified: Secondary | ICD-10-CM | POA: Diagnosis present

## 2022-03-02 DIAGNOSIS — M47817 Spondylosis without myelopathy or radiculopathy, lumbosacral region: Secondary | ICD-10-CM | POA: Insufficient documentation

## 2022-03-02 DIAGNOSIS — G894 Chronic pain syndrome: Secondary | ICD-10-CM | POA: Diagnosis not present

## 2022-03-02 DIAGNOSIS — M48061 Spinal stenosis, lumbar region without neurogenic claudication: Secondary | ICD-10-CM | POA: Diagnosis present

## 2022-03-02 DIAGNOSIS — M4726 Other spondylosis with radiculopathy, lumbar region: Secondary | ICD-10-CM | POA: Insufficient documentation

## 2022-03-02 DIAGNOSIS — G8929 Other chronic pain: Secondary | ICD-10-CM | POA: Insufficient documentation

## 2022-03-02 MED ORDER — HYDROCODONE-ACETAMINOPHEN 5-325 MG PO TABS: 1.0000 | ORAL_TABLET | Freq: Two times a day (BID) | ORAL | 0 refills | Status: DC | PRN

## 2022-03-02 MED ORDER — HYDROCODONE-ACETAMINOPHEN 5-325 MG PO TABS: 1.0000 | ORAL_TABLET | Freq: Two times a day (BID) | ORAL | 0 refills | Status: AC | PRN

## 2022-03-02 NOTE — Progress Notes (Signed)
Nursing Pain Medication Assessment:  Safety precautions to be maintained throughout the outpatient stay will include: orient to surroundings, keep bed in low position, maintain call bell within reach at all times, provide assistance with transfer out of bed and ambulation.  Medication Inspection Compliance: Pill count conducted under aseptic conditions, in front of the patient. Neither the pills nor the bottle was removed from the patient's sight at any time. Once count was completed pills were immediately returned to the patient in their original bottle.  Medication: See above Pill/Patch Count:  69 of 75 pills remain Pill/Patch Appearance: Markings consistent with prescribed medication Bottle Appearance: Standard pharmacy container. Clearly labeled. Filled Date: 69 / 18 / 2023 Last Medication intake:  TodaySafety precautions to be maintained throughout the outpatient stay will include: orient to surroundings, keep bed in low position, maintain call bell within reach at all times, provide assistance with transfer out of bed and ambulation.

## 2022-03-02 NOTE — Progress Notes (Signed)
PROVIDER NOTE: Information contained herein reflects review and annotations entered in association with encounter. Interpretation of such information and data should be left to medically-trained personnel. Information provided to patient can be located elsewhere in the medical record under "Patient Instructions". Document created using STT-dictation technology, any transcriptional errors that may result from process are unintentional.    Patient: Caroline Campbell  Service Category: E/M  Provider: Gillis Santa, MD  DOB: [birth year]  DOS: 03/02/2022  Specialty: Interventional Pain Management  MRN: [id number]  Setting: Ambulatory outpatient  PCP: Kirk Ruths, MD  Type: Established Patient    Referring Provider: Kirk Ruths, MD  Location: Office  Delivery: Face-to-face     HPI  [name], a 86 y.o. year old female, is here today because of her Chronic pain syndrome [G89.4]. [name] primary complain today is Back Pain (LOWER) Last encounter: My last encounter with her was on 12/08/21 Pertinent problems: 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 Pain Assessment: Severity of Chronic pain is reported as a 4 /10. Location: Back Medial, Lower/radiates to hips and shoulders. Onset: More than a month ago. Quality: Constant, Dull. Timing: Constant. Modifying factor(s): meds, heat. Vitals:  height is _0  (1.549 m) and weight is 112 lb (50.8 kg). Her temporal temperature is 97.3 F (36.3 C) (abnormal). Her blood pressure  is 182/70 (abnormal) and her pulse is 62. Her oxygen saturation is 100%.   Reason for encounter: medication management.   No change in medical history since last visit.  Patient's pain is at baseline.  Patient continues multimodal pain regimen as prescribed.      Pharmacotherapy Assessment  Analgesic: 5 mg every 12 hours as needed with an extra 15 tablets/month to take for breakthrough pain on evenings when her pain is higher   Monitoring: [place] PMP: PDMP reviewed during this encounter.       Pharmacotherapy: No side-effects or adverse reactions reported. Compliance: No problems identified. Effectiveness: Clinically acceptable.  Arlice Colt, RN  03/02/2022  8:05 AM  Sign when Signing Visit Nursing Pain Medication Assessment:  Safety precautions to be maintained throughout the outpatient stay will include: orient to surroundings, keep bed in low position, maintain call bell within reach at all times, provide assistance with transfer out of bed and ambulation.  Medication Inspection Compliance: Pill count conducted under aseptic conditions, in front of the patient. Neither the pills nor the bottle was removed from the patient's sight at any time. Once count was completed pills were immediately returned to the patient in their original bottle.  Medication: See above Pill/Patch Count:  69 of 75 pills remain Pill/Patch Appearance: Markings consistent with prescribed medication Bottle Appearance: Standard pharmacy container. Clearly labeled. Filled Date: 10 / 18 / 2023 Last Medication intake:  TodaySafety precautions to be maintained throughout the outpatient stay will include: orient to surroundings, keep bed in low position, maintain call bell within reach at all times, provide assistance with transfer out of bed and ambulation.      UDS:  Summary  Date Value Ref Range Status  04/15/2021 Note  Final    Comment:     ====================================================================  Compliance Drug Analysis, Ur ==================================================================== Test                             Result       Flag       Units  Drug Present and Declared for Prescription Verification   Gabapentin                     PRESENT      EXPECTED   Acetaminophen                  PRESENT      EXPECTED   Salicylate                     PRESENT      EXPECTED   Metoprolol                     PRESENT      EXPECTED  Drug Present not Declared for Prescription Verification   Diphenhydramine                PRESENT      UNEXPECTED  Drug Absent but Declared for Prescription Verification   Lidocaine                      Not Detected UNEXPECTED    Lidocaine, as indicated in the declared medication list, is not    always detected even when used as directed.  ==================================================================== Test                      Result    Flag   Units      Ref Range   Creatinine              136              mg/dL      >=20 ==================================================================== Declared Medications:  The flagging and interpretation on this report are based on the  following declared medications.  Unexpected results may arise from  inaccuracies in the declared medications.   **Note: The testing scope of this panel includes these medications:   Gabapentin (Neurontin)  Metoprolol (Toprol)   **Note: The testing scope of this panel does not include small to  moderate amounts of these reported medications:   Acetaminophen  Aspirin  Lidocaine   **Note: The testing scope of this panel does not include the  following reported medications:   Atorvastatin (Lipitor)  Chondroitin  Cyanocobalamin  Glucosamine  Omega-3 Fatty Acids  Pantoprazole (Protonix)  Ubiquinone (CoQ10)  Vitamin E ==================================================================== For  clinical consultation, please call [phone]. ====================================================================      ROS  Constitutional: Denies any fever or chills Gastrointestinal: No reported hemesis, hematochezia, vomiting, or acute GI distress Musculoskeletal:  low back pain Neurological: No reported episodes of acute onset apraxia, aphasia, dysarthria, agnosia, amnesia, paralysis, loss of coordination, or loss of consciousness  Medication Review  Aspirin-Acetaminophen, Coenzyme Q10, HYDROcodone-acetaminophen, Omega-3 Fatty Acids, Vitamin E Acetate, atorvastatin, gabapentin, glucosamine-chondroitin, lidocaine, metoprolol succinate, pantoprazole, and vitamin B-12  History Review  Allergy: [name] is allergic to panmycin [tetracycline], crestor [rosuvastatin calcium], and dexlansoprazole. Drug: [name]  reports no history of drug use. Alcohol:  reports current alcohol use. Tobacco:  reports that she has quit smoking. She has never used smokeless tobacco. Social: [name]  reports that she has quit smoking. She has  never used smokeless tobacco. She reports current alcohol use. She reports that she does not use drugs. Medical:  has a past medical history of Arthritis, Cancer ([place]), GERD (gastroesophageal reflux disease), Hyperlipidemia, Hypertension, Lumbar disc disease, and PMR (polymyalgia rheumatica) ([place]). Surgical: [name]  has a past surgical history that includes Abdominal hysterectomy; Colonoscopy with propofol (N/A, 09/06/2017); and Esophagogastroduodenoscopy (egd) with propofol (N/A, 09/06/2017). Family: family history is not on file.  Laboratory Chemistry Profile   Renal Lab Results  Component Value Date   BUN 12 04/15/2021   CREATININE 0.70 04/15/2021   BCR 17 04/15/2021    Hepatic Lab Results  Component Value Date   AST 20 04/15/2021   ALBUMIN 4.6 04/15/2021   ALKPHOS 72 04/15/2021    Electrolytes Lab Results  Component Value Date   NA 143  04/15/2021   K 4.6 04/15/2021   CL 106 04/15/2021   CALCIUM 9.8 04/15/2021   MG 2.1 04/15/2021    Bone Lab Results  Component Value Date   25OHVITD1 54 04/15/2021   25OHVITD2 <1.0 04/15/2021   25OHVITD3 54 04/15/2021    Inflammation (CRP: Acute Phase) (ESR: Chronic Phase) Lab Results  Component Value Date   CRP 1 04/15/2021   ESRSEDRATE 13 04/15/2021         Note: Above Lab results reviewed.  Recent Imaging Review  DG Lumbar Spine Complete W/Bend CLINICAL DATA:  Low back pain  EXAM: LUMBAR SPINE - COMPLETE WITH BENDING VIEWS  COMPARISON:  X-ray lumbar spine 05/01/2012  FINDINGS: Markedly limited evaluation due to overlapping osseous structures and overlying soft tissues.  Five non-rib-bearing lumbar vertebral bodies. Slight levocurvature of the lumbar spine centered at the L2-L3 level. Grade 1 anterolisthesis of L5 on S1. No change in alignment on flexion and extension views. Multilevel degenerative changes of the spine. There is no evidence of lumbar spine fracture. Alignment is normal. Intervertebral disc spaces are maintained. Aortic calcification.  IMPRESSION: 1. No acute displaced fracture or traumatic listhesis of the lumbar spine. 2. Grade 1 anterolisthesis of L5 on S1 with no change in alignment on extension and flexion views. 3.  Aortic Atherosclerosis (ICD10-I70.0).  Electronically Signed   By: Iven Finn M.D.   On: 04/15/2021 20:28 DG Thoracic Spine W/Swimmers CLINICAL DATA:  Back pain.  EXAM: THORACIC SPINE - 3 VIEWS  COMPARISON:  Chest radiograph dated 05/01/2012.  FINDINGS: No acute fracture or subluxation. The bones are osteopenic. Multilevel degenerative changes and scoliosis. Atherosclerotic calcification of the aorta. The soft tissues are unremarkable.  IMPRESSION: No acute/traumatic thoracic spine pathology.  Electronically Signed   By: Anner Crete M.D.   On: 04/15/2021 20:22 DG Cervical Spine With Flex &  Extend CLINICAL DATA:  Cervicalgia  EXAM: CERVICAL SPINE COMPLETE WITH FLEXION AND EXTENSION VIEWS  COMPARISON:  X-ray cervical spine 01/08/2013  FINDINGS: On the lateral view the cervical spine is visualized to the level of C7. Normal cervical lordosis. On flexion view there is development of grade 1 anterolisthesis of C2 on C3, C3 on C4, C4 on C5.  Dens is well positioned between the lateral masses of C1. There is limited evaluation of the dens for acute fracture on the open-mouth view due to overlying osseous structures.  Diffusely decreased bone density. Mild-to-moderate degenerative change of the spine at the C5 through C7 levels. Osseous neural foraminal stenosis at the C5-C6 level on the left. No acute displaced fracture is detected.No aggressive-appearing focal osseous lesions.  Pre-vertebral soft tissues are within normal limits.  IMPRESSION: 1.  On flexion view there is development of grade 1 anterolisthesis of C2 on C3, C3 on C4, C4 on C5. 2. Mild-to-moderate degenerative changes of the C5 through C7 levels with associated osseous neural foraminal stenosis at the C5-C6 level on the left. 3. No acute displaced fracture or traumatic listhesis of the cervical spine.  Electronically Signed   By: Iven Finn M.D.   On: 04/15/2021 18:46 DG HIP UNILAT W OR W/O PELVIS 2-3 VIEWS RIGHT CLINICAL DATA:  Right hip pain  EXAM: DG HIP (WITH OR WITHOUT PELVIS) 2-3V RIGHT  COMPARISON:  None.  FINDINGS: No acute fracture or dislocation identified in the right hip. Moderate joint space narrowing with acetabular subchondral sclerosis and small marginal osteophyte formation. No acute fracture identified in the pelvis. Bones are osteopenic.  IMPRESSION: Chronic changes with no acute osseous abnormality identified.  Electronically Signed   By: Ofilia Neas M.D.   On: 04/15/2021 16:45 DG Shoulder Right CLINICAL DATA:  Right shoulder pain  EXAM: RIGHT SHOULDER -  2+ VIEW  COMPARISON:  None.  FINDINGS: No acute fracture or dislocation identified. Mild joint space narrowing. No suspicious bony lesions identified.  IMPRESSION: No acute osseous abnormality identified.  Electronically Signed   By: Ofilia Neas M.D.   On: 04/15/2021 16:44 Note: Reviewed        Physical Exam  General appearance: Well nourished, well developed, and well hydrated. In no apparent acute distress Mental status: Alert, oriented x 3 (person, place, & time)       Respiratory: No evidence of acute respiratory distress Eyes: PERLA Vitals: BP (!) 182/70 (BP Location: Right Arm, Patient Position: Sitting, Cuff Size: Normal)   Pulse 62   Temp (!) 97.3 F (36.3 C) (Temporal)   Ht _0  (1.549 m)   Wt 112 lb (50.8 kg)   SpO2 100%   BMI 21.16 kg/m  BMI: Estimated body mass index is 21.16 kg/m as calculated from the following:   Height as of this encounter: _1  (1.549 m).   Weight as of this encounter: 112 lb (50.8 kg). Ideal: Ideal body weight: 47.8 kg (105 lb 6.1 oz) Adjusted ideal body weight: 49 kg (108 lb 0.4 oz)  Lumbar Spine Area Exam  Skin & Axial Inspection: No masses, redness, or swelling Alignment: Symmetrical Functional ROM: Pain restricted ROM affecting both sides Stability: No instability detected Muscle Tone/Strength: Functionally intact. No obvious neuro-muscular anomalies detected. Sensory (Neurological): Dermatomal pain pattern and MSK   Lower Extremity Exam      Side: Right lower extremity   Side: Left lower extremity  Stability: No instability observed           Stability: No instability observed          Skin & Extremity Inspection: Skin color, temperature, and hair growth are WNL. No peripheral edema or cyanosis. No masses, redness, swelling, asymmetry, or associated skin lesions. No contractures.   Skin & Extremity Inspection: Skin color, temperature, and hair growth are WNL. No peripheral edema or cyanosis. No masses, redness, swelling,  asymmetry, or associated skin lesions. No contractures.  Functional ROM: Pain restricted ROM for hip joint           Functional ROM: Pain restricted ROM for hip joint          Muscle Tone/Strength: Functionally intact. No obvious neuro-muscular anomalies detected.   Muscle Tone/Strength: Functionally intact. No obvious neuro-muscular anomalies detected.  Sensory (Neurological): Musculoskeletal pain pattern         Sensory (  Neurological): Musculoskeletal pain pattern        DTR: Patellar: deferred today Achilles: deferred today Plantar: deferred today   DTR: Patellar: deferred today Achilles: deferred today Plantar: deferred today  Palpation: No palpable anomalies   Palpation: No palpable anomalies     Assessment   Status Diagnosis  Controlled Controlled Controlled 1. Chronic pain syndrome   2. Osteoarthritis of spine with radiculopathy, lumbar region   3. 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 Lumbosacral facet arthropathy   5. Chronic low back pain (1ry area of Pain) (Bilateral) (R>L) w/o sciatica   6. Grade 1 Anterolisthesis of lumbosacral spine (L5/S1)   7. 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  Ms. MALYN Caroline Campbell has a current medication list which includes the following long-term medication(s): atorvastatin, gabapentin, metoprolol succinate, and pantoprazole.  Pharmacotherapy (Medications Ordered): Meds ordered this encounter  Medications   HYDROcodone-acetaminophen (NORCO/VICODIN) 5-325 MG tablet    Sig: Take 1-2 tablets by mouth every 12 (twelve) hours as needed for severe pain. Must last 30 days.    Dispense:  75 tablet    Refill:  0    Chronic Pain: STOP Act (Not applicable) Fill 1 day early if closed on refill date. Avoid benzodiazepines within 8 hours of opioids   HYDROcodone-acetaminophen (NORCO/VICODIN) 5-325 MG tablet    Sig:  Take 1-2 tablets by mouth every 12 (twelve) hours as needed for severe pain. Must last 30 days.    Dispense:  75 tablet    Refill:  0    Chronic Pain: STOP Act (Not applicable) Fill 1 day early if closed on refill date. Avoid benzodiazepines within 8 hours of opioids   HYDROcodone-acetaminophen (NORCO/VICODIN) 5-325 MG tablet    Sig: Take 1-2 tablets by mouth every 12 (twelve) hours as needed for severe pain. Must last 30 days.    Dispense:  75 tablet    Refill:  0    Chronic Pain: STOP Act (Not applicable) Fill 1 day early if closed on refill date. Avoid benzodiazepines within 8 hours of opioids   Continue gabapentin 300 mg nightly, lidocaine patch as needed, managed by PCP. UDS up-to-date and appropriate.  Follow-up plan:   Return in about 15 weeks (around 06/15/2022) for Medication Management, in person.    Recent Visits Date Type Provider Dept  12/08/21 Office Visit Gillis Santa, MD [facility]  Showing recent visits within past 90 days and meeting all other requirements Today's Visits Date Type Provider Dept  03/02/22 Office Visit Gillis Santa, MD [facility]  Showing today's visits and meeting all other requirements Future Appointments No visits were found meeting these conditions. Showing future appointments within next 90 days and meeting all other requirements  I discussed the assessment and treatment plan with the patient. The patient was provided an opportunity to ask questions and all were answered. The patient agreed with the plan and demonstrated an understanding of the instructions.  Patient advised to call back or seek an in-person evaluation if the symptoms or condition worsens.  Duration of encounter: 109mnutes.  Note by: BGillis Santa MD Date: 03/02/2022; Time: 8:33 AM

## 2022-05-27 ENCOUNTER — Telehealth: Payer: Self-pay | Admitting: Student in an Organized Health Care Education/Training Program

## 2022-05-27 NOTE — Telephone Encounter (Signed)
PT stated that she called pharmacy to get hydrocodone prescription for refill. PT was told by pharmacy that no refill had been send in. Please give patient a call. TY

## 2022-05-28 NOTE — Telephone Encounter (Signed)
Spoke with [facility] and they do have a Rx for patients pain medicine that is able to fill today.  Called and let patient know that they do have the Rx.

## 2022-06-10 ENCOUNTER — Encounter: Payer: Self-pay | Admitting: Student in an Organized Health Care Education/Training Program

## 2022-06-10 ENCOUNTER — Ambulatory Visit
Payer: Medicare Other | Attending: Student in an Organized Health Care Education/Training Program | Admitting: Student in an Organized Health Care Education/Training Program

## 2022-06-10 VITALS — BP 133/102 | HR 66 | Temp 97.0°F | Resp 16 | Ht 61.0 in | Wt 111.0 lb

## 2022-06-10 DIAGNOSIS — G894 Chronic pain syndrome: Secondary | ICD-10-CM | POA: Insufficient documentation

## 2022-06-10 DIAGNOSIS — Z0289 Encounter for other administrative examinations: Secondary | ICD-10-CM | POA: Insufficient documentation

## 2022-06-10 DIAGNOSIS — M4726 Other spondylosis with radiculopathy, lumbar region: Secondary | ICD-10-CM | POA: Insufficient documentation

## 2022-06-10 DIAGNOSIS — M545 Low back pain, unspecified: Secondary | ICD-10-CM | POA: Insufficient documentation

## 2022-06-10 DIAGNOSIS — G8929 Other chronic pain: Secondary | ICD-10-CM | POA: Insufficient documentation

## 2022-06-10 DIAGNOSIS — M48061 Spinal stenosis, lumbar region without neurogenic claudication: Secondary | ICD-10-CM | POA: Diagnosis present

## 2022-06-10 DIAGNOSIS — M47817 Spondylosis without myelopathy or radiculopathy, lumbosacral region: Secondary | ICD-10-CM | POA: Diagnosis present

## 2022-06-10 DIAGNOSIS — M4317 Spondylolisthesis, lumbosacral region: Secondary | ICD-10-CM | POA: Insufficient documentation

## 2022-06-10 MED ORDER — HYDROCODONE-ACETAMINOPHEN 5-325 MG PO TABS: 1.0000 | ORAL_TABLET | Freq: Two times a day (BID) | ORAL | 0 refills | Status: AC | PRN

## 2022-06-10 MED ORDER — HYDROCODONE-ACETAMINOPHEN 5-325 MG PO TABS: 1.0000 | ORAL_TABLET | Freq: Two times a day (BID) | ORAL | 0 refills | Status: DC | PRN

## 2022-06-10 NOTE — Progress Notes (Signed)
PROVIDER NOTE: Information contained herein reflects review and annotations entered in association with encounter. Interpretation of such information and data should be left to medically-trained personnel. Information provided to patient can be located elsewhere in the medical record under "Patient Instructions". Document created using STT-dictation technology, any transcriptional errors that may result from process are unintentional.    Patient: [name]  Service Category: E/M  Provider: Gillis Santa, MD  DOB: 1935/04/14  DOS: 06/10/2022  Specialty: Interventional Pain Management  MRN: [id number]  Setting: Ambulatory outpatient  PCP: Kirk Ruths, MD  Type: Established Patient    Referring Provider: Kirk Ruths, MD  Location: Office  Delivery: Face-to-face     HPI  Caroline Campbell, a 87 y.o. year old female, is here today because of her Chronic pain syndrome [G89.4]. 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 Pain Assessment: Severity of Chronic pain is reported as a 6 /10. Location: Back Lower, Mid/right buttocks/hip. Onset: More than a month ago. Quality: Aching, Constant, Stabbing, Radiating. Timing: Constant. Modifying factor(s): meds, rest, heating pad. Vitals:  height is '5\' 1"'$  (1.549 m) and weight is 111 lb (50.3 kg). Her temperature is 97 F  (36.1 C) (abnormal). Her blood pressure is 133/102 (abnormal) and her pulse is 66. Her respiration is 16 and oxygen saturation is 98%.   Reason for encounter: medication management.   No change in medical history since last visit.  Patient's pain is at baseline.  Patient continues multimodal pain regimen as prescribed.   Increased periscapular pain related to cervical facet joint syndrome. Offered cervical MBNB but patient declined Also had her trial TENS unit in clinic today with our [employer] represntative   Pharmacotherapy Assessment  Analgesic: 5 mg every 12 hours as needed with an extra 15 tablets/month to take for breakthrough pain on evenings when her pain is higher   Monitoring: [place] PMP: PDMP reviewed during this encounter.       Pharmacotherapy: No side-effects or adverse reactions reported. Compliance: No problems identified. Effectiveness: Clinically acceptable.  Ignatius Specking, RN  06/10/2022  8:41 AM  Sign when Signing Visit Nursing Pain Medication Assessment:  Safety precautions to be maintained throughout the outpatient stay will include: orient to surroundings, keep bed in low position, maintain call bell within reach at all times, provide assistance with transfer out of bed and ambulation.  Medication Inspection Compliance: Pill count conducted under aseptic conditions, in front of the patient. Neither the pills nor the bottle was removed from the patient's sight at any time. Once count was completed pills were immediately returned to the patient in their original bottle.  Medication: Hydrocodone/APAP Pill/Patch Count:  41 of 75 pills remain Pill/Patch Appearance: Markings consistent with prescribed medication Bottle Appearance: Standard pharmacy container. Clearly labeled. Filled Date: 2 / 68 / 2024 Last Medication intake:  Today     UDS:  Summary  Date Value Ref Range Status  04/15/2021 Note  Final    Comment:     ==================================================================== Compliance Drug  Analysis, Ur ==================================================================== Test                             Result       Flag       Units  Drug Present and Declared for Prescription Verification   Gabapentin                     PRESENT      EXPECTED   Acetaminophen                  PRESENT      EXPECTED   Salicylate                     PRESENT      EXPECTED   Metoprolol                     PRESENT      EXPECTED  Drug Present not Declared for Prescription Verification   Diphenhydramine                PRESENT      UNEXPECTED  Drug Absent but Declared for Prescription Verification   Lidocaine                      Not Detected UNEXPECTED    Lidocaine, as indicated in the declared medication list, is not    always detected even when used as directed.  ==================================================================== Test                      Result    Flag   Units      Ref Range   Creatinine              136              mg/dL      >=20 ==================================================================== Declared Medications:  The flagging and interpretation on this report are based on the  following declared medications.  Unexpected results may arise from  inaccuracies in the declared medications.   **Note: The testing scope of this panel includes these medications:   Gabapentin (Neurontin)  Metoprolol (Toprol)   **Note: The testing scope of this panel does not include small to  moderate amounts of these reported medications:   Acetaminophen  Aspirin  Lidocaine   **Note: The testing scope of this panel does not include the  following reported medications:   Atorvastatin (Lipitor)  Chondroitin  Cyanocobalamin  Glucosamine  Omega-3 Fatty Acids  Pantoprazole (Protonix)  Ubiquinone (CoQ10)  Vitamin E ==================================================================== For  clinical consultation, please call [phone]. ====================================================================      ROS  Constitutional: Denies any fever or chills Gastrointestinal: No reported hemesis, hematochezia, vomiting, or acute GI distress Musculoskeletal:  Periscapular, low back pain Neurological: No reported episodes of acute onset apraxia, aphasia, dysarthria, agnosia, amnesia, paralysis, loss of coordination, or loss of consciousness  Medication Review  Aspirin-Acetaminophen, Coenzyme Q10, HYDROcodone-acetaminophen, Omega-3 Fatty Acids, Vitamin E Acetate, atorvastatin, gabapentin, glucosamine-chondroitin, lidocaine, metoprolol succinate, pantoprazole, and vitamin B-12  History Review  Allergy: Caroline Campbell is allergic to panmycin [tetracycline], crestor [rosuvastatin calcium], and dexlansoprazole. Drug: Caroline Campbell  reports no history of drug use. Alcohol:  reports current alcohol use. Tobacco:  reports that she has quit smoking. She has never used smokeless tobacco. Social: Caroline Campbell  reports that she has quit smoking. She has never  used smokeless tobacco. She reports current alcohol use. She reports that she does not use drugs. Medical:  has a past medical history of Arthritis, Cancer ([place]), GERD (gastroesophageal reflux disease), Hyperlipidemia, Hypertension, Lumbar disc disease, and PMR (polymyalgia rheumatica) ([place]). Surgical: Caroline Campbell  has a past surgical history that includes Abdominal hysterectomy; Colonoscopy with propofol (N/A, 09/06/2017); and Esophagogastroduodenoscopy (egd) with propofol (N/A, 09/06/2017). Family: family history is not on file.  Laboratory Chemistry Profile   Renal Lab Results  Component Value Date   BUN 12 04/15/2021   CREATININE 0.70 04/15/2021   BCR 17 04/15/2021    Hepatic Lab Results  Component Value Date   AST 20 04/15/2021   ALBUMIN 4.6 04/15/2021   ALKPHOS 72 04/15/2021    Electrolytes Lab Results  Component Value  Date   NA 143 04/15/2021   K 4.6 04/15/2021   CL 106 04/15/2021   CALCIUM 9.8 04/15/2021   MG 2.1 04/15/2021    Bone Lab Results  Component Value Date   25OHVITD1 54 04/15/2021   25OHVITD2 <1.0 04/15/2021   25OHVITD3 54 04/15/2021    Inflammation (CRP: Acute Phase) (ESR: Chronic Phase) Lab Results  Component Value Date   CRP 1 04/15/2021   ESRSEDRATE 13 04/15/2021         Note: Above Lab results reviewed.  Recent Imaging Review  DG Lumbar Spine Complete W/Bend CLINICAL DATA:  Low back pain  EXAM: LUMBAR SPINE - COMPLETE WITH BENDING VIEWS  COMPARISON:  X-ray lumbar spine 05/01/2012  FINDINGS: Markedly limited evaluation due to overlapping osseous structures and overlying soft tissues.  Five non-rib-bearing lumbar vertebral bodies. Slight levocurvature of the lumbar spine centered at the L2-L3 level. Grade 1 anterolisthesis of L5 on S1. No change in alignment on flexion and extension views. Multilevel degenerative changes of the spine. There is no evidence of lumbar spine fracture. Alignment is normal. Intervertebral disc spaces are maintained. Aortic calcification.  IMPRESSION: 1. No acute displaced fracture or traumatic listhesis of the lumbar spine. 2. Grade 1 anterolisthesis of L5 on S1 with no change in alignment on extension and flexion views. 3.  Aortic Atherosclerosis (ICD10-I70.0).  Electronically Signed   By: Iven Finn M.D.   On: 04/15/2021 20:28 DG Thoracic Spine W/Swimmers CLINICAL DATA:  Back pain.  EXAM: THORACIC SPINE - 3 VIEWS  COMPARISON:  Chest radiograph dated 05/01/2012.  FINDINGS: No acute fracture or subluxation. The bones are osteopenic. Multilevel degenerative changes and scoliosis. Atherosclerotic calcification of the aorta. The soft tissues are unremarkable.  IMPRESSION: No acute/traumatic thoracic spine pathology.  Electronically Signed   By: Anner Crete M.D.   On: 04/15/2021 20:22 DG Cervical Spine With Flex  & Extend CLINICAL DATA:  Cervicalgia  EXAM: CERVICAL SPINE COMPLETE WITH FLEXION AND EXTENSION VIEWS  COMPARISON:  X-ray cervical spine 01/08/2013  FINDINGS: On the lateral view the cervical spine is visualized to the level of C7. Normal cervical lordosis. On flexion view there is development of grade 1 anterolisthesis of C2 on C3, C3 on C4, C4 on C5.  Dens is well positioned between the lateral masses of C1. There is limited evaluation of the dens for acute fracture on the open-mouth view due to overlying osseous structures.  Diffusely decreased bone density. Mild-to-moderate degenerative change of the spine at the C5 through C7 levels. Osseous neural foraminal stenosis at the C5-C6 level on the left. No acute displaced fracture is detected.No aggressive-appearing focal osseous lesions.  Pre-vertebral soft tissues are within normal limits.  IMPRESSION: 1. On  flexion view there is development of grade 1 anterolisthesis of C2 on C3, C3 on C4, C4 on C5. 2. Mild-to-moderate degenerative changes of the C5 through C7 levels with associated osseous neural foraminal stenosis at the C5-C6 level on the left. 3. No acute displaced fracture or traumatic listhesis of the cervical spine.  Electronically Signed   By: Iven Finn M.D.   On: 04/15/2021 18:46 DG HIP UNILAT W OR W/O PELVIS 2-3 VIEWS RIGHT CLINICAL DATA:  Right hip pain  EXAM: DG HIP (WITH OR WITHOUT PELVIS) 2-3V RIGHT  COMPARISON:  None.  FINDINGS: No acute fracture or dislocation identified in the right hip. Moderate joint space narrowing with acetabular subchondral sclerosis and small marginal osteophyte formation. No acute fracture identified in the pelvis. Bones are osteopenic.  IMPRESSION: Chronic changes with no acute osseous abnormality identified.  Electronically Signed   By: Ofilia Neas M.D.   On: 04/15/2021 16:45 DG Shoulder Right CLINICAL DATA:  Right shoulder pain  EXAM: RIGHT SHOULDER  - 2+ VIEW  COMPARISON:  None.  FINDINGS: No acute fracture or dislocation identified. Mild joint space narrowing. No suspicious bony lesions identified.  IMPRESSION: No acute osseous abnormality identified.  Electronically Signed   By: Ofilia Neas M.D.   On: 04/15/2021 16:44 Note: Reviewed        Physical Exam  General appearance: Well nourished, well developed, and well hydrated. In no apparent acute distress Mental status: Alert, oriented x 3 (person, place, & time)       Respiratory: No evidence of acute respiratory distress Eyes: PERLA Vitals: BP (!) 133/102 Comment: takes BP med at time  Pulse 66   Temp (!) 97 F (36.1 C)   Resp 16   Ht '5\' 1"'$  (1.549 m)   Wt 111 lb (50.3 kg)   SpO2 98%   BMI 20.97 kg/m  BMI: Estimated body mass index is 20.97 kg/m as calculated from the following:   Height as of this encounter: '5\' 1"'$  (1.549 m).   Weight as of this encounter: 111 lb (50.3 kg). Ideal: Ideal body weight: 47.8 kg (105 lb 6.1 oz) Adjusted ideal body weight: 48.8 kg (107 lb 10 oz)  Periscapular pain, tender to palpation  Lumbar Spine Area Exam  Skin & Axial Inspection: No masses, redness, or swelling Alignment: Symmetrical Functional ROM: Pain restricted ROM affecting both sides Stability: No instability detected Muscle Tone/Strength: Functionally intact. No obvious neuro-muscular anomalies detected. Sensory (Neurological): Dermatomal pain pattern and MSK   Lower Extremity Exam      Side: Right lower extremity   Side: Left lower extremity  Stability: No instability observed           Stability: No instability observed          Skin & Extremity Inspection: Skin color, temperature, and hair growth are WNL. No peripheral edema or cyanosis. No masses, redness, swelling, asymmetry, or associated skin lesions. No contractures.   Skin & Extremity Inspection: Skin color, temperature, and hair growth are WNL. No peripheral edema or cyanosis. No masses, redness, swelling,  asymmetry, or associated skin lesions. No contractures.  Functional ROM: Pain restricted ROM for hip joint           Functional ROM: Pain restricted ROM for hip joint          Muscle Tone/Strength: Functionally intact. No obvious neuro-muscular anomalies detected.   Muscle Tone/Strength: Functionally intact. No obvious neuro-muscular anomalies detected.  Sensory (Neurological): Musculoskeletal pain pattern  Sensory (Neurological): Musculoskeletal pain pattern        DTR: Patellar: deferred today Achilles: deferred today Plantar: deferred today   DTR: Patellar: deferred today Achilles: deferred today Plantar: deferred today  Palpation: No palpable anomalies   Palpation: No palpable anomalies     Assessment   Status Diagnosis  Controlled Controlled Controlled 1. Chronic pain syndrome   2. Osteoarthritis of spine with radiculopathy, lumbar region   3. 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 Lumbosacral facet arthropathy   5. Chronic low back pain (1ry area of Pain) (Bilateral) (R>L) w/o sciatica   6. Grade 1 Anterolisthesis of lumbosacral spine (L5/S1)   7. 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 Pain medication agreement signed         Plan of Care  Problem-specific:  No problem-specific Assessment & Plan notes found for this encounter.  Caroline Campbell has a current medication list which includes the following long-term medication(s): atorvastatin, gabapentin, metoprolol succinate, and pantoprazole.  Pharmacotherapy (Medications Ordered): Meds ordered this encounter  Medications   HYDROcodone-acetaminophen (NORCO/VICODIN) 5-325 MG tablet    Sig: Take 1-2 tablets by mouth every 12 (twelve) hours as needed for severe pain. Must last 30 days.    Dispense:  75 tablet    Refill:  0    Chronic Pain: STOP Act (Not applicable) Fill 1 day early if closed on refill date. Avoid benzodiazepines within 8 hours of opioids   HYDROcodone-acetaminophen  (NORCO/VICODIN) 5-325 MG tablet    Sig: Take 1-2 tablets by mouth every 12 (twelve) hours as needed for severe pain. Must last 30 days.    Dispense:  75 tablet    Refill:  0    Chronic Pain: STOP Act (Not applicable) Fill 1 day early if closed on refill date. Avoid benzodiazepines within 8 hours of opioids   HYDROcodone-acetaminophen (NORCO/VICODIN) 5-325 MG tablet    Sig: Take 1-2 tablets by mouth every 12 (twelve) hours as needed for severe pain. Must last 30 days.    Dispense:  75 tablet    Refill:  0    Chronic Pain: STOP Act (Not applicable) Fill 1 day early if closed on refill date. Avoid benzodiazepines within 8 hours of opioids   Continue gabapentin 300 mg nightly, lidocaine patch as needed, managed by PCP. Trial TENS unit, [employer] representative to assist with demo and fitting today UDS up-to-date and appropriate.  Follow-up plan:   Return in about 3 months (around 09/08/2022) for Medication Management, in person.    Recent Visits No visits were found meeting these conditions. Showing recent visits within past 90 days and meeting all other requirements Today's Visits Date Type Provider Dept  06/10/22 Office Visit Gillis Santa, MD [facility]  Showing today's visits and meeting all other requirements Future Appointments No visits were found meeting these conditions. Showing future appointments within next 90 days and meeting all other requirements  I discussed the assessment and treatment plan with the patient. The patient was provided an opportunity to ask questions and all were answered. The patient agreed with the plan and demonstrated an understanding of the instructions.  Patient advised to call back or seek an in-person evaluation if the symptoms or condition worsens.  Duration of encounter: 80mnutes.  Note by: BGillis Santa MD Date: 06/10/2022; Time: 9:39 AM

## 2022-06-10 NOTE — Progress Notes (Signed)
Nursing Pain Medication Assessment:  Safety precautions to be maintained throughout the outpatient stay will include: orient to surroundings, keep bed in low position, maintain call bell within reach at all times, provide assistance with transfer out of bed and ambulation.  Medication Inspection Compliance: Pill count conducted under aseptic conditions, in front of the patient. Neither the pills nor the bottle was removed from the patient's sight at any time. Once count was completed pills were immediately returned to the patient in their original bottle.  Medication: Hydrocodone/APAP Pill/Patch Count:  41 of 75 pills remain Pill/Patch Appearance: Markings consistent with prescribed medication Bottle Appearance: Standard pharmacy container. Clearly labeled. Filled Date: 2 / 6 / 2024 Last Medication intake:  Today

## 2022-06-15 ENCOUNTER — Ambulatory Visit: Payer: Medicare Other | Admitting: Dermatology

## 2022-08-04 ENCOUNTER — Ambulatory Visit: Payer: Medicare Other | Admitting: Dermatology

## 2022-08-04 VITALS — BP 142/68

## 2022-08-04 DIAGNOSIS — L900 Lichen sclerosus et atrophicus: Secondary | ICD-10-CM

## 2022-08-04 DIAGNOSIS — Z7189 Other specified counseling: Secondary | ICD-10-CM

## 2022-08-04 DIAGNOSIS — L570 Actinic keratosis: Secondary | ICD-10-CM

## 2022-08-04 DIAGNOSIS — L578 Other skin changes due to chronic exposure to nonionizing radiation: Secondary | ICD-10-CM | POA: Diagnosis not present

## 2022-08-04 DIAGNOSIS — Z79899 Other long term (current) drug therapy: Secondary | ICD-10-CM

## 2022-08-04 DIAGNOSIS — H61002 Unspecified perichondritis of left external ear: Secondary | ICD-10-CM | POA: Diagnosis not present

## 2022-08-04 MED ORDER — CLOBETASOL PROPIONATE 0.05 % EX OINT: 1.0000 | TOPICAL_OINTMENT | CUTANEOUS | 2 refills | Status: DC

## 2022-08-04 NOTE — Patient Instructions (Addendum)
Lichen sclerosus is a chronic inflammatory condition of unknown cause that frequently involves the vaginal area and less commonly extragenital skin, and is NOT sexually transmitted. It frequently causes symptoms of pain and burning.  It requires regular monitoring and treatment with topical steroids to minimize inflammation and to reduce risk of scarring. There is also a risk of cancer in the vaginal area which is very low if inflammation is well controlled. Regular checks of the area are recommended. Please call if you notice any new or changing spots within this area.   Due to recent changes in healthcare laws, you may see results of your pathology and/or laboratory studies on MyChart before the doctors have had a chance to review them. We understand that in some cases there may be results that are confusing or concerning to you. Please understand that not all results are received at the same time and often the doctors may need to interpret multiple results in order to provide you with the best plan of care or course of treatment. Therefore, we ask that you please give us 2 business days to thoroughly review all your results before contacting the office for clarification. Should we see a critical lab result, you will be contacted sooner.   If You Need Anything After Your Visit  If you have any questions or concerns for your doctor, please call our main line at [phone] and press option 4 to reach your doctor's medical assistant. If no one answers, please leave a voicemail as directed and we will return your call as soon as possible. Messages left after 4 pm will be answered the following business day.   You may also send us a message via MyChart. We typically respond to MyChart messages within 1-2 business days.  For prescription refills, please ask your pharmacy to contact our office. Our fax number is [phone].  If you have an urgent issue when the clinic is closed that cannot wait until  the next business day, you can page your doctor at the number below.    Please note that while we do our best to be available for urgent issues outside of office hours, we are not available 24/7.   If you have an urgent issue and are unable to reach us, you may choose to seek medical care at your doctor's office, retail clinic, urgent care center, or emergency room.  If you have a medical emergency, please immediately call 911 or go to the emergency department.  Pager Numbers  - Dr. Kowalski: [phone]  - Dr. Moye: [phone]  - Dr. Stewart: [phone]  In the event of inclement weather, please call our main line at [phone] for an update on the status of any delays or closures.  Dermatology Medication Tips: Please keep the boxes that topical medications come in in order to help keep track of the instructions about where and how to use these. Pharmacies typically print the medication instructions only on the boxes and not directly on the medication tubes.   If your medication is too expensive, please contact our office at [phone] option 4 or send us a message through MyChart.   We are unable to tell what your co-pay for medications will be in advance as this is different depending on your insurance coverage. However, we may be able to find a substitute medication at lower cost or fill out paperwork to get insurance to cover a needed medication.   If a prior authorization is required to get your medication   covered by your insurance company, please allow us 1-2 business days to complete this process.  Drug prices often vary depending on where the prescription is filled and some pharmacies may offer cheaper prices.  The website www.goodrx.com contains coupons for medications through different pharmacies. The prices here do not account for what the cost may be with help from insurance (it may be cheaper with your insurance), but the website can give you the price if you did  not use any insurance.  - You can print the associated coupon and take it with your prescription to the pharmacy.  - You may also stop by our office during regular business hours and pick up a GoodRx coupon card.  - If you need your prescription sent electronically to a different pharmacy, notify our office through [facility] MyChart or by phone at [phone] option 4.     Si Usted Necesita Algo Despus de Su Visita  Tambin puede enviarnos un mensaje a travs de MyChart. Por lo general respondemos a los mensajes de MyChart en el transcurso de 1 a 2 das hbiles.  Para renovar recetas, por favor pida a su farmacia que se ponga en contacto con nuestra oficina. Nuestro nmero de fax es el [phone].  Si tiene un asunto urgente cuando la clnica est cerrada y que no puede esperar hasta el siguiente da hbil, puede llamar/localizar a su doctor(a) al nmero que aparece a continuacin.   Por favor, tenga en cuenta que aunque hacemos todo lo posible para estar disponibles para asuntos urgentes fuera del horario de oficina, no estamos disponibles las 24 horas del da, los 7 das de la semana.   Si tiene un problema urgente y no puede comunicarse con nosotros, puede optar por buscar atencin mdica  en el consultorio de su doctor(a), en una clnica privada, en un centro de atencin urgente o en una sala de emergencias.  Si tiene una emergencia mdica, por favor llame inmediatamente al 911 o vaya a la sala de emergencias.  Nmeros de bper  - Dr. Kowalski: [phone]  - Dra. Moye: [phone]  - Dra. Stewart: [phone]  En caso de inclemencias del tiempo, por favor llame a nuestra lnea principal al [phone] para una actualizacin sobre el estado de cualquier retraso o cierre.  Consejos para la medicacin en dermatologa: Por favor, guarde las cajas en las que vienen los medicamentos de uso tpico para ayudarle a seguir las instrucciones sobre dnde y cmo usarlos. Las  farmacias generalmente imprimen las instrucciones del medicamento slo en las cajas y no directamente en los tubos del medicamento.   Si su medicamento es muy caro, por favor, pngase en contacto con nuestra oficina llamando al [phone] y presione la opcin 4 o envenos un mensaje a travs de MyChart.   No podemos decirle cul ser su copago por los medicamentos por adelantado ya que esto es diferente dependiendo de la cobertura de su seguro. Sin embargo, es posible que podamos encontrar un medicamento sustituto a menor costo o llenar un formulario para que el seguro cubra el medicamento que se considera necesario.   Si se requiere una autorizacin previa para que su compaa de seguros cubra su medicamento, por favor permtanos de 1 a 2 das hbiles para completar este proceso.  Los precios de los medicamentos varan con frecuencia dependiendo del lugar de dnde se surte la receta y alguna farmacias pueden ofrecer precios ms baratos.  El sitio web www.goodrx.com tiene cupones para medicamentos de diferentes farmacias. Los precios aqu   no tienen en cuenta lo que podra costar con la ayuda del seguro (puede ser ms barato con su seguro), pero el sitio web puede darle el precio si no utiliz ningn seguro.  - Puede imprimir el cupn correspondiente y llevarlo con su receta a la farmacia.  - Tambin puede pasar por nuestra oficina durante el horario de atencin regular y recoger una tarjeta de cupones de GoodRx.  - Si necesita que su receta se enve electrnicamente a una farmacia diferente, informe a nuestra oficina a travs de MyChart de [place] o por telfono llamando al [phone] y presione la opcin 4.  

## 2022-08-04 NOTE — Progress Notes (Signed)
New Patient Visit   Subjective  Caroline Campbell is a 87 y.o. female who presents for the following: check spot L ear >1 yr, hurts when sleeping, tried a cream but did not work, pt said she has skin cancer on her Vagina, she saw a Dr (she can't remember who) a few years ago and was told there was no treatment, pt says it burns when she goes to bathroom   The following portions of the chart were reviewed this encounter and updated as appropriate: medications, allergies, medical history  Review of Systems:  No other skin or systemic complaints except as noted in HPI or Assessment and Plan.  Objective  Well appearing patient in no apparent distress; mood and affect are within normal limits.   A focused examination was performed of the following areas: Left ear  Relevant exam findings are noted in the Assessment and Plan.  Left Ear x 1 Pink scaly macule    Assessment & Plan   CHONDRODERMATITIS NODULARIS HELICIS Exam: Scaly pink papule or plaque overlying prominent cartilage at the L ear  Chondrodermatitis Nodularis Chronica Helicis (CNCH or CNH) is a common, benign inflammatory condition of the ear cartilage and overlying skin associated with very sensitive tender papule(s).  Trauma or pressure from sleeping on the ear or from cell phone use and sun damage may be exacerbating factors.  Treatment may include using a C-shaped airplane neck pillow for sleeping on the side of the head so no pressure is on the ear.  Other treatments include topical or intralesional steroids; liquid nitrogen or laser destruction; shave removal or excision.  The condition can be difficult to treat and persist or recur despite treatment.  Treatment Plan: LN2 today   LICHEN SCLEROSUS ET ATROPHICUS Exam: Erythema in labia minora and introitus, shiny white color of the inner labia minora with atrophy, perianal area normal  Lichen sclerosus is a chronic inflammatory condition of unknown cause that frequently  involves the vaginal area and less commonly extragenital skin, and is NOT sexually transmitted. It frequently causes symptoms of pain and burning.  It requires regular monitoring and treatment with topical steroids to minimize inflammation and to reduce risk of scarring. There is also a risk of cancer in the vaginal area which is very low if inflammation is well controlled. Regular checks of the area are recommended. Please call if you notice any new or changing spots within this area.  Treatment Plan: Start Clobetasol oint qd 5d/wk aa groin, avoid face, axilla  AK (actinic keratosis) Left Ear x 1  With CNCH  Destruction of lesion - Left Ear x 1 Complexity: simple   Destruction method: cryotherapy   Informed consent: discussed and consent obtained   Timeout:  patient name, date of birth, surgical site, and procedure verified Lesion destroyed using liquid nitrogen: Yes   Region frozen until ice ball extended beyond lesion: Yes   Outcome: patient tolerated procedure well with no complications   Post-procedure details: wound care instructions given     Topical steroids (such as triamcinolone, fluocinolone, fluocinonide, mometasone, clobetasol, halobetasol, betamethasone, hydrocortisone) can cause thinning and lightening of the skin if they are used for too long in the same area. Your physician has selected the right strength medicine for your problem and area affected on the body. Please use your medication only as directed by your physician to prevent side effects.    Return in about 3 months (around 11/03/2022) for AK/CNCH f/u, LS&A f/u.  I, Sonya Hupman, RMA,  am acting as scribe for Armida Sans, MD .   Documentation: I have reviewed the above documentation for accuracy and completeness, and I agree with the above.  Armida Sans, MD

## 2022-08-10 ENCOUNTER — Encounter: Payer: Self-pay | Admitting: Dermatology

## 2022-08-25 DIAGNOSIS — R7303 Prediabetes: Secondary | ICD-10-CM | POA: Insufficient documentation

## 2022-09-21 ENCOUNTER — Ambulatory Visit
Payer: Medicare Other | Attending: Student in an Organized Health Care Education/Training Program | Admitting: Student in an Organized Health Care Education/Training Program

## 2022-09-21 ENCOUNTER — Encounter: Payer: Self-pay | Admitting: Student in an Organized Health Care Education/Training Program

## 2022-09-21 DIAGNOSIS — M4726 Other spondylosis with radiculopathy, lumbar region: Secondary | ICD-10-CM

## 2022-09-21 DIAGNOSIS — M48061 Spinal stenosis, lumbar region without neurogenic claudication: Secondary | ICD-10-CM

## 2022-09-21 DIAGNOSIS — G894 Chronic pain syndrome: Secondary | ICD-10-CM

## 2022-09-21 MED ORDER — HYDROCODONE-ACETAMINOPHEN 5-325 MG PO TABS: 1.0000 | ORAL_TABLET | Freq: Two times a day (BID) | ORAL | 0 refills | Status: AC | PRN

## 2022-09-21 MED ORDER — HYDROCODONE-ACETAMINOPHEN 5-325 MG PO TABS: 1.0000 | ORAL_TABLET | Freq: Two times a day (BID) | ORAL | 0 refills | Status: DC | PRN

## 2022-09-21 NOTE — Progress Notes (Signed)
Nursing Pain Medication Assessment:  Safety precautions to be maintained throughout the outpatient stay will include: orient to surroundings, keep bed in low position, maintain call bell within reach at all times, provide assistance with transfer out of bed and ambulation.  Medication Inspection Compliance: Pill count conducted under aseptic conditions, in front of the patient. Neither the pills nor the bottle was removed from the patient's sight at any time. Once count was completed pills were immediately returned to the patient in their original bottle.  Medication: Hydrocodone/APAP Pill/Patch Count: 10/75 Pill/Patch Appearance: Markings consistent with prescribed medication Bottle Appearance: Standard pharmacy container. Clearly labeled. Filled Date: 05 / 15 / 2024 Last Medication intake:  Today

## 2022-09-21 NOTE — Progress Notes (Signed)
PROVIDER NOTE: Information contained herein reflects review and annotations entered in association with encounter. Interpretation of such information and data should be left to medically-trained personnel. Information provided to patient can be located elsewhere in the medical record under "Patient Instructions". Document created using STT-dictation technology, any transcriptional errors that may result from process are unintentional.    Patient: Caroline Campbell  Service Category: E/M  Provider: Edward Jolly, MD  DOB: [birth year]  DOS: 09/21/2022  Specialty: Interventional Pain Management  MRN: [id number]  Setting: Ambulatory outpatient  PCP: Lauro Regulus, MD  Type: Established Patient    Referring Provider: Lauro Regulus, MD  Location: Office  Delivery: Face-to-face     HPI  [name], a 87 y.o. year old female, is here today because of her No primary diagnosis found.. Ms. 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 Pain Assessment: Severity of Chronic pain is reported as a 7 /10. Location: Back Right, Left, Lower/to hips, RIGHT side worse. Onset:  . Quality:  . Timing:  . Modifying factor(s):  Marland Kitchen Vitals:  height is 5\' 1"  (1.549 m) and weight is 114 lb (51.7 kg). Her temporal temperature is 96.8 F (36 C) (abnormal). Her blood pressure is 148/77 (abnormal) and  her pulse is 59 (abnormal). Her respiration is 17 and oxygen saturation is 100%.   Reason for encounter: medication management.   No change in medical history since last visit.  Patient's pain is at baseline.  Patient continues multimodal pain regimen as prescribed.      Pharmacotherapy Assessment  Analgesic: 5 mg every 12 hours as needed with an extra 15 tablets/month to take for breakthrough pain on evenings when her pain is higher   Monitoring: [place] PMP: PDMP reviewed during this encounter.       Pharmacotherapy: No side-effects or adverse reactions reported. Compliance: No problems identified. Effectiveness: Clinically acceptable.  Caroline Mattes, Caroline Campbell  09/21/2022 10:05 AM  Sign when Signing Visit Nursing Pain Medication Assessment:  Safety precautions to be maintained throughout the outpatient stay will include: orient to surroundings, keep bed in low position, maintain call bell within reach at all times, provide assistance with transfer out of bed and ambulation.  Medication Inspection Compliance: Pill count conducted under aseptic conditions, in front of the patient. Neither the pills nor the bottle was removed from the patient's sight at any time. Once count was completed pills were immediately returned to the patient in their original bottle.  Medication: Hydrocodone/APAP Pill/Patch Count: 10/75 Pill/Patch Appearance: Markings consistent with prescribed medication Bottle Appearance: Standard pharmacy container. Clearly labeled. Filled Date: 05 / 15 / 2024 Last Medication intake:  Today     UDS:  Summary  Date Value Ref Range Status  04/15/2021 Note  Final    Comment:    ==================================================================== Compliance Drug Analysis, Ur ==================================================================== Test                             Result  Flag       Units  Drug Present and Declared for Prescription Verification   Gabapentin                      PRESENT      EXPECTED   Acetaminophen                  PRESENT      EXPECTED   Salicylate                     PRESENT      EXPECTED   Metoprolol                     PRESENT      EXPECTED  Drug Present not Declared for Prescription Verification   Diphenhydramine                PRESENT      UNEXPECTED  Drug Absent but Declared for Prescription Verification   Lidocaine                      Not Detected UNEXPECTED    Lidocaine, as indicated in the declared medication list, is not    always detected even when used as directed.  ==================================================================== Test                      Result    Flag   Units      Ref Range   Creatinine              136              mg/dL      >=16 ==================================================================== Declared Medications:  The flagging and interpretation on this report are based on the  following declared medications.  Unexpected results may arise from  inaccuracies in the declared medications.   **Note: The testing scope of this panel includes these medications:   Gabapentin (Neurontin)  Metoprolol (Toprol)   **Note: The testing scope of this panel does not include small to  moderate amounts of these reported medications:   Acetaminophen  Aspirin  Lidocaine   **Note: The testing scope of this panel does not include the  following reported medications:   Atorvastatin (Lipitor)  Chondroitin  Cyanocobalamin  Glucosamine  Omega-3 Fatty Acids  Pantoprazole (Protonix)  Ubiquinone (CoQ10)  Vitamin E ==================================================================== For clinical consultation, please call [phone]. ====================================================================      ROS  Constitutional: Denies any fever or chills Gastrointestinal: No reported hemesis, hematochezia, vomiting, or acute GI distress Musculoskeletal:  Periscapular, low back  pain Neurological: No reported episodes of acute onset apraxia, aphasia, dysarthria, agnosia, amnesia, paralysis, loss of coordination, or loss of consciousness  Medication Review  Aspirin-Acetaminophen, Coenzyme Q10, HYDROcodone-acetaminophen, Omega-3 Fatty Acids, Vitamin E Acetate, atorvastatin, clobetasol ointment, gabapentin, glucosamine-chondroitin, lidocaine, metoprolol succinate, pantoprazole, and vitamin B-12  History Review  Allergy: Caroline Campbell is allergic to panmycin [tetracycline], crestor [rosuvastatin calcium], and dexlansoprazole. Drug: Ms. Iturralde  reports no history of drug use. Alcohol:  reports current alcohol use. Tobacco:  reports that she has quit smoking. She has never used smokeless tobacco. Social: Caroline Campbell  reports that she has quit smoking. She has never used smokeless tobacco. She reports current alcohol use. She reports that she does not use drugs. Medical:  has a past medical history of Arthritis, Cancer (HCC), GERD (gastroesophageal reflux disease), Hyperlipidemia, Hypertension, Lumbar disc disease,  and PMR (polymyalgia rheumatica) (HCC). Surgical: Caroline Campbell  has a past surgical history that includes Abdominal hysterectomy; Colonoscopy with propofol (N/A, 09/06/2017); and Esophagogastroduodenoscopy (egd) with propofol (N/A, 09/06/2017). Family: family history is not on file.  Laboratory Chemistry Profile   Renal Lab Results  Component Value Date   BUN 12 04/15/2021   CREATININE 0.70 04/15/2021   BCR 17 04/15/2021    Hepatic Lab Results  Component Value Date   AST 20 04/15/2021   ALBUMIN 4.6 04/15/2021   ALKPHOS 72 04/15/2021    Electrolytes Lab Results  Component Value Date   NA 143 04/15/2021   K 4.6 04/15/2021   CL 106 04/15/2021   CALCIUM 9.8 04/15/2021   MG 2.1 04/15/2021    Bone Lab Results  Component Value Date   25OHVITD1 54 04/15/2021   25OHVITD2 <1.0 04/15/2021   25OHVITD3 54 04/15/2021    Inflammation (CRP: Acute Phase) (ESR:  Chronic Phase) Lab Results  Component Value Date   CRP 1 04/15/2021   ESRSEDRATE 13 04/15/2021         Note: Above Lab results reviewed.  Recent Imaging Review  DG Lumbar Spine Complete W/Bend CLINICAL DATA:  Low back pain  EXAM: LUMBAR SPINE - COMPLETE WITH BENDING VIEWS  COMPARISON:  X-ray lumbar spine 05/01/2012  FINDINGS: Markedly limited evaluation due to overlapping osseous structures and overlying soft tissues.  Five non-rib-bearing lumbar vertebral bodies. Slight levocurvature of the lumbar spine centered at the L2-L3 level. Grade 1 anterolisthesis of L5 on S1. No change in alignment on flexion and extension views. Multilevel degenerative changes of the spine. There is no evidence of lumbar spine fracture. Alignment is normal. Intervertebral disc spaces are maintained. Aortic calcification.  IMPRESSION: 1. No acute displaced fracture or traumatic listhesis of the lumbar spine. 2. Grade 1 anterolisthesis of L5 on S1 with no change in alignment on extension and flexion views. 3.  Aortic Atherosclerosis (ICD10-I70.0).  Electronically Signed   By: Tish Frederickson M.D.   On: 04/15/2021 20:28 DG Thoracic Spine W/Swimmers CLINICAL DATA:  Back pain.  EXAM: THORACIC SPINE - 3 VIEWS  COMPARISON:  Chest radiograph dated 05/01/2012.  FINDINGS: No acute fracture or subluxation. The bones are osteopenic. Multilevel degenerative changes and scoliosis. Atherosclerotic calcification of the aorta. The soft tissues are unremarkable.  IMPRESSION: No acute/traumatic thoracic spine pathology.  Electronically Signed   By: Caroline Campbell M.D.   On: 04/15/2021 20:22 DG Cervical Spine With Flex & Extend CLINICAL DATA:  Cervicalgia  EXAM: CERVICAL SPINE COMPLETE WITH FLEXION AND EXTENSION VIEWS  COMPARISON:  X-ray cervical spine 01/08/2013  FINDINGS: On the lateral view the cervical spine is visualized to the level of C7. Normal cervical lordosis. On flexion view  there is development of grade 1 anterolisthesis of C2 on C3, C3 on C4, C4 on C5.  Dens is well positioned between the lateral masses of C1. There is limited evaluation of the dens for acute fracture on the open-mouth view due to overlying osseous structures.  Diffusely decreased bone density. Mild-to-moderate degenerative change of the spine at the C5 through C7 levels. Osseous neural foraminal stenosis at the C5-C6 level on the left. No acute displaced fracture is detected.No aggressive-appearing focal osseous lesions.  Pre-vertebral soft tissues are within normal limits.  IMPRESSION: 1. On flexion view there is development of grade 1 anterolisthesis of C2 on C3, C3 on C4, C4 on C5. 2. Mild-to-moderate degenerative changes of the C5 through C7 levels with associated osseous neural foraminal stenosis at  the C5-C6 level on the left. 3. No acute displaced fracture or traumatic listhesis of the cervical spine.  Electronically Signed   By: Tish Frederickson M.D.   On: 04/15/2021 18:46 DG HIP UNILAT W OR W/O PELVIS 2-3 VIEWS RIGHT CLINICAL DATA:  Right hip pain  EXAM: DG HIP (WITH OR WITHOUT PELVIS) 2-3V RIGHT  COMPARISON:  None.  FINDINGS: No acute fracture or dislocation identified in the right hip. Moderate joint space narrowing with acetabular subchondral sclerosis and small marginal osteophyte formation. No acute fracture identified in the pelvis. Bones are osteopenic.  IMPRESSION: Chronic changes with no acute osseous abnormality identified.  Electronically Signed   By: Jannifer Hick M.D.   On: 04/15/2021 16:45 DG Shoulder Right CLINICAL DATA:  Right shoulder pain  EXAM: RIGHT SHOULDER - 2+ VIEW  COMPARISON:  None.  FINDINGS: No acute fracture or dislocation identified. Mild joint space narrowing. No suspicious bony lesions identified.  IMPRESSION: No acute osseous abnormality identified.  Electronically Signed   By: Jannifer Hick M.D.   On:  04/15/2021 16:44 Note: Reviewed        Physical Exam  General appearance: Well nourished, well developed, and well hydrated. In no apparent acute distress Mental status: Alert, oriented x 3 (person, place, & time)       Respiratory: No evidence of acute respiratory distress Eyes: PERLA Vitals: BP (!) 148/77   Pulse (!) 59   Temp (!) 96.8 F (36 C) (Temporal)   Resp 17   Ht 5\' 1"  (1.549 m)   Wt 114 lb (51.7 kg)   SpO2 100%   BMI 21.54 kg/m  BMI: Estimated body mass index is 21.54 kg/m as calculated from the following:   Height as of this encounter: 5\' 1"  (1.549 m).   Weight as of this encounter: 114 lb (51.7 kg). Ideal: Ideal body weight: 47.8 kg (105 lb 6.1 oz) Adjusted ideal body weight: 49.4 kg (108 lb 13.2 oz)  Periscapular pain, tender to palpation  Lumbar Spine Area Exam  Skin & Axial Inspection: No masses, redness, or swelling Alignment: Symmetrical Functional ROM: Pain restricted ROM affecting both sides Stability: No instability detected Muscle Tone/Strength: Functionally intact. No obvious neuro-muscular anomalies detected. Sensory (Neurological): Dermatomal pain pattern and MSK   Lower Extremity Exam      Side: Right lower extremity   Side: Left lower extremity  Stability: No instability observed           Stability: No instability observed          Skin & Extremity Inspection: Skin color, temperature, and hair growth are WNL. No peripheral edema or cyanosis. No masses, redness, swelling, asymmetry, or associated skin lesions. No contractures.   Skin & Extremity Inspection: Skin color, temperature, and hair growth are WNL. No peripheral edema or cyanosis. No masses, redness, swelling, asymmetry, or associated skin lesions. No contractures.  Functional ROM: Pain restricted ROM for hip joint           Functional ROM: Pain restricted ROM for hip joint          Muscle Tone/Strength: Functionally intact. No obvious neuro-muscular anomalies detected.   Muscle  Tone/Strength: Functionally intact. No obvious neuro-muscular anomalies detected.  Sensory (Neurological): Musculoskeletal pain pattern         Sensory (Neurological): Musculoskeletal pain pattern        DTR: Patellar: deferred today Achilles: deferred today Plantar: deferred today   DTR: Patellar: deferred today Achilles: deferred today Plantar: deferred today  Palpation: No  palpable anomalies   Palpation: No palpable anomalies     Assessment   Status Diagnosis  Controlled Controlled Controlled 1. Chronic pain syndrome   2. 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 Osteoarthritis of spine with radiculopathy, lumbar region          Plan of Care  Problem-specific:  No problem-specific Assessment & Plan notes found for this encounter.  Ms. KENSLIE Campbell has a current medication list which includes the following long-term medication(s): atorvastatin, gabapentin, metoprolol succinate, and pantoprazole.  Pharmacotherapy (Medications Ordered): Meds ordered this encounter  Medications   HYDROcodone-acetaminophen (NORCO/VICODIN) 5-325 MG tablet    Sig: Take 1-2 tablets by mouth every 12 (twelve) hours as needed for severe pain. Must last 30 days.    Dispense:  75 tablet    Refill:  0    Chronic Pain: STOP Act (Not applicable) Fill 1 day early if closed on refill date. Avoid benzodiazepines within 8 hours of opioids   HYDROcodone-acetaminophen (NORCO/VICODIN) 5-325 MG tablet    Sig: Take 1-2 tablets by mouth every 12 (twelve) hours as needed for severe pain. Must last 30 days.    Dispense:  75 tablet    Refill:  0    Chronic Pain: STOP Act (Not applicable) Fill 1 day early if closed on refill date. Avoid benzodiazepines within 8 hours of opioids   HYDROcodone-acetaminophen (NORCO/VICODIN) 5-325 MG tablet    Sig: Take 1-2 tablets by mouth every 12 (twelve) hours as needed for severe pain. Must last 30 days.    Dispense:  75 tablet    Refill:  0    Chronic Pain: STOP Act  (Not applicable) Fill 1 day early if closed on refill date. Avoid benzodiazepines within 8 hours of opioids    Orders Placed This Encounter  Procedures   ToxASSURE Select 13 (MW), Urine    Volume: 30 ml(s). Minimum 3 ml of urine is needed. Document temperature of fresh sample. Indications: Long term (current) use of opiate analgesic (Z61.096)    Order Specific Question:   Release to patient    Answer:   Immediate    Continue gabapentin 300 mg nightly, lidocaine patch as needed, managed by PCP.   Follow-up plan:   Return in about 3 months (around 12/22/2022) for Medication Management, in person.    Recent Visits No visits were found meeting these conditions. Showing recent visits within past 90 days and meeting all other requirements Today's Visits Date Type Provider Dept  09/21/22 Office Visit Edward Jolly, MD [facility]  Showing today's visits and meeting all other requirements Future Appointments Date Type Provider Dept  12/16/22 Appointment Edward Jolly, MD [facility]  Showing future appointments within next 90 days and meeting all other requirements  I discussed the assessment and treatment plan with the patient. The patient was provided an opportunity to ask questions and all were answered. The patient agreed with the plan and demonstrated an understanding of the instructions.  Patient advised to call back or seek an in-person evaluation if the symptoms or condition worsens.  Duration of encounter: .  Note by: Edward Jolly, MD Date: 09/21/2022; Time: 11:12 AM PROVIDER NOTE: Information contained herein reflects review and annotations entered in association with encounter. Interpretation of such information and data should be left to medically-trained personnel. Information provided to patient can be located elsewhere in the medical record under "Patient Instructions". Document created using STT-dictation technology, any transcriptional errors  that may result from process are unintentional.  Patient: Caroline Campbell  Service Category: E/M  Provider: Edward Jolly, MD  DOB: Nov 08, 1935  DOS: 09/21/2022  Specialty: Interventional Pain Management  MRN: [id number]  Setting: Ambulatory outpatient  PCP: Lauro Regulus, MD  Type: Established Patient    Referring Provider: Lauro Regulus, MD  Location: Office  Delivery: Face-to-face     HPI  [name], a 87 y.o. year old female, is here today because of her No primary diagnosis found.. Ms. 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 Pain Assessment: Severity of Chronic pain is reported as a 7 /10. Location: Back Right, Left, Lower/to hips, RIGHT side worse. Onset:  . Quality:  . Timing:  . Modifying factor(s):  Marland Kitchen Vitals:  height is 5\' 1"  (1.549 m) and weight is 114 lb (51.7 kg). Her temporal temperature is 96.8 F (36 C) (abnormal). Her blood pressure is 148/77 (abnormal) and her pulse is 59 (abnormal). Her respiration is 17 and oxygen saturation is 100%.   Reason for encounter: medication management.   No change in medical history since last visit.  Patient's pain is at baseline.  Patient continues multimodal pain regimen as prescribed.   Increased periscapular pain related to cervical facet joint syndrome. Offered cervical MBNB but patient  declined Also had her trial TENS unit in clinic today with our [employer] represntative   Pharmacotherapy Assessment  Analgesic: 5 mg every 12 hours as needed with an extra 15 tablets/month to take for breakthrough pain on evenings when her pain is higher   Monitoring: [place] PMP: PDMP reviewed during this encounter.       Pharmacotherapy: No side-effects or adverse reactions reported. Compliance: No problems identified. Effectiveness: Clinically acceptable.  Caroline Mattes, Caroline Campbell  09/21/2022 10:05 AM  Sign when Signing Visit Nursing Pain Medication Assessment:  Safety precautions to be maintained throughout the outpatient stay will include: orient to surroundings, keep bed in low position, maintain call bell within reach at all times, provide assistance with transfer out of bed and ambulation.  Medication Inspection Compliance: Pill count conducted under aseptic conditions, in front of the patient. Neither the pills nor the bottle was removed from the patient's sight at any time. Once count was completed pills were immediately returned to the patient in their original bottle.  Medication: Hydrocodone/APAP Pill/Patch Count: 10/75 Pill/Patch Appearance: Markings consistent with prescribed medication Bottle Appearance: Standard pharmacy container. Clearly labeled. Filled Date: 05 / 15 / 2024 Last Medication intake:  Today     UDS:  Summary  Date Value Ref Range Status  04/15/2021 Note  Final    Comment:    ==================================================================== Compliance Drug Analysis, Ur ==================================================================== Test                             Result       Flag       Units  Drug Present and Declared for Prescription Verification   Gabapentin  PRESENT      EXPECTED   Acetaminophen                  PRESENT      EXPECTED   Salicylate                     PRESENT      EXPECTED   Metoprolol                     PRESENT       EXPECTED  Drug Present not Declared for Prescription Verification   Diphenhydramine                PRESENT      UNEXPECTED  Drug Absent but Declared for Prescription Verification   Lidocaine                      Not Detected UNEXPECTED    Lidocaine, as indicated in the declared medication list, is not    always detected even when used as directed.  ==================================================================== Test                      Result    Flag   Units      Ref Range   Creatinine              136              mg/dL      >=16 ==================================================================== Declared Medications:  The flagging and interpretation on this report are based on the  following declared medications.  Unexpected results may arise from  inaccuracies in the declared medications.   **Note: The testing scope of this panel includes these medications:   Gabapentin (Neurontin)  Metoprolol (Toprol)   **Note: The testing scope of this panel does not include small to  moderate amounts of these reported medications:   Acetaminophen  Aspirin  Lidocaine   **Note: The testing scope of this panel does not include the  following reported medications:   Atorvastatin (Lipitor)  Chondroitin  Cyanocobalamin  Glucosamine  Omega-3 Fatty Acids  Pantoprazole (Protonix)  Ubiquinone (CoQ10)  Vitamin E ==================================================================== For clinical consultation, please call [phone]. ====================================================================      ROS  Constitutional: Denies any fever or chills Gastrointestinal: No reported hemesis, hematochezia, vomiting, or acute GI distress Musculoskeletal:  Periscapular, low back pain Neurological: No reported episodes of acute onset apraxia, aphasia, dysarthria, agnosia, amnesia, paralysis, loss of coordination, or loss of consciousness  Medication Review   Aspirin-Acetaminophen, Coenzyme Q10, HYDROcodone-acetaminophen, Omega-3 Fatty Acids, Vitamin E Acetate, atorvastatin, clobetasol ointment, gabapentin, glucosamine-chondroitin, lidocaine, metoprolol succinate, pantoprazole, and vitamin B-12  History Review  Allergy: Caroline Campbell is allergic to panmycin [tetracycline], crestor [rosuvastatin calcium], and dexlansoprazole. Drug: Caroline Campbell  reports no history of drug use. Alcohol:  reports current alcohol use. Tobacco:  reports that she has quit smoking. She has never used smokeless tobacco. Social: Caroline Campbell  reports that she has quit smoking. She has never used smokeless tobacco. She reports current alcohol use. She reports that she does not use drugs. Medical:  has a past medical history of Arthritis, Cancer (HCC), GERD (gastroesophageal reflux disease), Hyperlipidemia, Hypertension, Lumbar disc disease, and PMR (polymyalgia rheumatica) (HCC). Surgical: Caroline Campbell  has a past surgical history that includes Abdominal hysterectomy; Colonoscopy with propofol (N/A, 09/06/2017); and Esophagogastroduodenoscopy (egd) with propofol (N/A, 09/06/2017). Family: family history is not on file.  Laboratory  Chemistry Profile   Renal Lab Results  Component Value Date   BUN 12 04/15/2021   CREATININE 0.70 04/15/2021   BCR 17 04/15/2021    Hepatic Lab Results  Component Value Date   AST 20 04/15/2021   ALBUMIN 4.6 04/15/2021   ALKPHOS 72 04/15/2021    Electrolytes Lab Results  Component Value Date   NA 143 04/15/2021   K 4.6 04/15/2021   CL 106 04/15/2021   CALCIUM 9.8 04/15/2021   MG 2.1 04/15/2021    Bone Lab Results  Component Value Date   25OHVITD1 54 04/15/2021   25OHVITD2 <1.0 04/15/2021   25OHVITD3 54 04/15/2021    Inflammation (CRP: Acute Phase) (ESR: Chronic Phase) Lab Results  Component Value Date   CRP 1 04/15/2021   ESRSEDRATE 13 04/15/2021         Note: Above Lab results reviewed.  Recent Imaging Review  DG Lumbar  Spine Complete W/Bend CLINICAL DATA:  Low back pain  EXAM: LUMBAR SPINE - COMPLETE WITH BENDING VIEWS  COMPARISON:  X-ray lumbar spine 05/01/2012  FINDINGS: Markedly limited evaluation due to overlapping osseous structures and overlying soft tissues.  Five non-rib-bearing lumbar vertebral bodies. Slight levocurvature of the lumbar spine centered at the L2-L3 level. Grade 1 anterolisthesis of L5 on S1. No change in alignment on flexion and extension views. Multilevel degenerative changes of the spine. There is no evidence of lumbar spine fracture. Alignment is normal. Intervertebral disc spaces are maintained. Aortic calcification.  IMPRESSION: 1. No acute displaced fracture or traumatic listhesis of the lumbar spine. 2. Grade 1 anterolisthesis of L5 on S1 with no change in alignment on extension and flexion views. 3.  Aortic Atherosclerosis (ICD10-I70.0).  Electronically Signed   By: Tish Frederickson M.D.   On: 04/15/2021 20:28 DG Thoracic Spine W/Swimmers CLINICAL DATA:  Back pain.  EXAM: THORACIC SPINE - 3 VIEWS  COMPARISON:  Chest radiograph dated 05/01/2012.  FINDINGS: No acute fracture or subluxation. The bones are osteopenic. Multilevel degenerative changes and scoliosis. Atherosclerotic calcification of the aorta. The soft tissues are unremarkable.  IMPRESSION: No acute/traumatic thoracic spine pathology.  Electronically Signed   By: Caroline Campbell M.D.   On: 04/15/2021 20:22 DG Cervical Spine With Flex & Extend CLINICAL DATA:  Cervicalgia  EXAM: CERVICAL SPINE COMPLETE WITH FLEXION AND EXTENSION VIEWS  COMPARISON:  X-ray cervical spine 01/08/2013  FINDINGS: On the lateral view the cervical spine is visualized to the level of C7. Normal cervical lordosis. On flexion view there is development of grade 1 anterolisthesis of C2 on C3, C3 on C4, C4 on C5.  Dens is well positioned between the lateral masses of C1. There is limited evaluation of the dens  for acute fracture on the open-mouth view due to overlying osseous structures.  Diffusely decreased bone density. Mild-to-moderate degenerative change of the spine at the C5 through C7 levels. Osseous neural foraminal stenosis at the C5-C6 level on the left. No acute displaced fracture is detected.No aggressive-appearing focal osseous lesions.  Pre-vertebral soft tissues are within normal limits.  IMPRESSION: 1. On flexion view there is development of grade 1 anterolisthesis of C2 on C3, C3 on C4, C4 on C5. 2. Mild-to-moderate degenerative changes of the C5 through C7 levels with associated osseous neural foraminal stenosis at the C5-C6 level on the left. 3. No acute displaced fracture or traumatic listhesis of the cervical spine.  Electronically Signed   By: Tish Frederickson M.D.   On: 04/15/2021 18:46 DG HIP UNILAT W OR W/O  PELVIS 2-3 VIEWS RIGHT CLINICAL DATA:  Right hip pain  EXAM: DG HIP (WITH OR WITHOUT PELVIS) 2-3V RIGHT  COMPARISON:  None.  FINDINGS: No acute fracture or dislocation identified in the right hip. Moderate joint space narrowing with acetabular subchondral sclerosis and small marginal osteophyte formation. No acute fracture identified in the pelvis. Bones are osteopenic.  IMPRESSION: Chronic changes with no acute osseous abnormality identified.  Electronically Signed   By: Jannifer Hick M.D.   On: 04/15/2021 16:45 DG Shoulder Right CLINICAL DATA:  Right shoulder pain  EXAM: RIGHT SHOULDER - 2+ VIEW  COMPARISON:  None.  FINDINGS: No acute fracture or dislocation identified. Mild joint space narrowing. No suspicious bony lesions identified.  IMPRESSION: No acute osseous abnormality identified.  Electronically Signed   By: Jannifer Hick M.D.   On: 04/15/2021 16:44 Note: Reviewed        Physical Exam  General appearance: Well nourished, well developed, and well hydrated. In no apparent acute distress Mental status: Alert,  oriented x 3 (person, place, & time)       Respiratory: No evidence of acute respiratory distress Eyes: PERLA Vitals: BP (!) 148/77   Pulse (!) 59   Temp (!) 96.8 F (36 C) (Temporal)   Resp 17   Ht 5\' 1"  (1.549 m)   Wt 114 lb (51.7 kg)   SpO2 100%   BMI 21.54 kg/m  BMI: Estimated body mass index is 21.54 kg/m as calculated from the following:   Height as of this encounter: 5\' 1"  (1.549 m).   Weight as of this encounter: 114 lb (51.7 kg). Ideal: Ideal body weight: 47.8 kg (105 lb 6.1 oz) Adjusted ideal body weight: 49.4 kg (108 lb 13.2 oz)  Periscapular pain, tender to palpation  Lumbar Spine Area Exam  Skin & Axial Inspection: No masses, redness, or swelling Alignment: Symmetrical Functional ROM: Pain restricted ROM affecting both sides Stability: No instability detected Muscle Tone/Strength: Functionally intact. No obvious neuro-muscular anomalies detected. Sensory (Neurological): Dermatomal pain pattern and MSK   Lower Extremity Exam      Side: Right lower extremity   Side: Left lower extremity  Stability: No instability observed           Stability: No instability observed          Skin & Extremity Inspection: Skin color, temperature, and hair growth are WNL. No peripheral edema or cyanosis. No masses, redness, swelling, asymmetry, or associated skin lesions. No contractures.   Skin & Extremity Inspection: Skin color, temperature, and hair growth are WNL. No peripheral edema or cyanosis. No masses, redness, swelling, asymmetry, or associated skin lesions. No contractures.  Functional ROM: Pain restricted ROM for hip joint           Functional ROM: Pain restricted ROM for hip joint          Muscle Tone/Strength: Functionally intact. No obvious neuro-muscular anomalies detected.   Muscle Tone/Strength: Functionally intact. No obvious neuro-muscular anomalies detected.  Sensory (Neurological): Musculoskeletal pain pattern         Sensory (Neurological): Musculoskeletal pain  pattern        DTR: Patellar: deferred today Achilles: deferred today Plantar: deferred today   DTR: Patellar: deferred today Achilles: deferred today Plantar: deferred today  Palpation: No palpable anomalies   Palpation: No palpable anomalies     Assessment   Status Diagnosis  Controlled Controlled Controlled 1. Chronic pain syndrome   2. 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 Osteoarthritis of spine  with radiculopathy, lumbar region          Plan of Care  Problem-specific:  No problem-specific Assessment & Plan notes found for this encounter.  Ms. SHERYLANN PRIEUR has a current medication list which includes the following long-term medication(s): atorvastatin, gabapentin, metoprolol succinate, and pantoprazole.  Pharmacotherapy (Medications Ordered): Meds ordered this encounter  Medications   HYDROcodone-acetaminophen (NORCO/VICODIN) 5-325 MG tablet    Sig: Take 1-2 tablets by mouth every 12 (twelve) hours as needed for severe pain. Must last 30 days.    Dispense:  75 tablet    Refill:  0    Chronic Pain: STOP Act (Not applicable) Fill 1 day early if closed on refill date. Avoid benzodiazepines within 8 hours of opioids   HYDROcodone-acetaminophen (NORCO/VICODIN) 5-325 MG tablet    Sig: Take 1-2 tablets by mouth every 12 (twelve) hours as needed for severe pain. Must last 30 days.    Dispense:  75 tablet    Refill:  0    Chronic Pain: STOP Act (Not applicable) Fill 1 day early if closed on refill date. Avoid benzodiazepines within 8 hours of opioids   HYDROcodone-acetaminophen (NORCO/VICODIN) 5-325 MG tablet    Sig: Take 1-2 tablets by mouth every 12 (twelve) hours as needed for severe pain. Must last 30 days.    Dispense:  75 tablet    Refill:  0    Chronic Pain: STOP Act (Not applicable) Fill 1 day early if closed on refill date. Avoid benzodiazepines within 8 hours of opioids   Continue gabapentin 300 mg nightly, lidocaine patch as needed, managed by  PCP. Trial TENS unit, [employer] representative to assist with demo and fitting today UDS up-to-date and appropriate.  Follow-up plan:   Return in about 3 months (around 12/22/2022) for Medication Management, in person.    Recent Visits No visits were found meeting these conditions. Showing recent visits within past 90 days and meeting all other requirements Today's Visits Date Type Provider Dept  09/21/22 Office Visit Edward Jolly, MD [facility]  Showing today's visits and meeting all other requirements Future Appointments Date Type Provider Dept  12/16/22 Appointment Edward Jolly, MD [facility]  Showing future appointments within next 90 days and meeting all other requirements  I discussed the assessment and treatment plan with the patient. The patient was provided an opportunity to ask questions and all were answered. The patient agreed with the plan and demonstrated an understanding of the instructions.  Patient advised to call back or seek an in-person evaluation if the symptoms or condition worsens.  Duration of encounter: .  Note by: Edward Jolly, MD Date: 09/21/2022; Time: 11:12 AM

## 2022-09-24 LAB — TOXASSURE SELECT 13 (MW), URINE

## 2022-11-03 ENCOUNTER — Ambulatory Visit: Payer: Medicare Other | Admitting: Dermatology

## 2022-11-03 VITALS — BP 138/82 | HR 84

## 2022-11-03 DIAGNOSIS — H61022 Chronic perichondritis of left external ear: Secondary | ICD-10-CM

## 2022-11-03 DIAGNOSIS — Z79899 Other long term (current) drug therapy: Secondary | ICD-10-CM

## 2022-11-03 DIAGNOSIS — L900 Lichen sclerosus et atrophicus: Secondary | ICD-10-CM

## 2022-11-03 DIAGNOSIS — H61002 Unspecified perichondritis of left external ear: Secondary | ICD-10-CM

## 2022-11-03 MED ORDER — TRIAMCINOLONE ACETONIDE 10 MG/ML IJ SUSP
5.0000 mg | Freq: Once | INTRAMUSCULAR | Status: AC
  Administered 2022-11-03: 5 mg

## 2022-11-03 MED ORDER — CLOBETASOL PROPIONATE 0.05 % EX OINT: 1.0000 | TOPICAL_OINTMENT | CUTANEOUS | 1 refills | Status: DC

## 2022-11-03 NOTE — Progress Notes (Signed)
   Follow-Up Visit   Subjective  Caroline Campbell is a 87 y.o. female who presents for the following: 3 months f/u on a irritated spot on the left ear, treated with LN2 2 months ago with a poor response. Patient using Clobetasol ointment in the groin area for rash with a good response.  The following portions of the chart were reviewed this encounter and updated as appropriate: medications, allergies, medical history  Review of Systems:  No other skin or systemic complaints except as noted in HPI or Assessment and Plan.  Objective  Well appearing patient in no apparent distress; mood and affect are within normal limits. A focused examination was performed of the following areas: face,ears Relevant exam findings are noted in the Assessment and Plan.  Assessment & Plan   LICHEN SCLEROSUS ET ATROPHICUS Exam: no exam today at the labia minora and introitus    Lichen sclerosus is a chronic inflammatory condition of unknown cause that frequently involves the vaginal area and less commonly extragenital skin, and is NOT sexually transmitted. It frequently causes symptoms of pain and burning.  It requires regular monitoring and treatment with topical steroids to minimize inflammation and to reduce risk of scarring. There is also a risk of cancer in the vaginal area which is very low if inflammation is well controlled. Regular checks of the area are recommended. Please call if you notice any new or changing spots within this area.  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 Condition is bothersome/symptomatic for patient. Currently flared. Chondrodermatitis Nodularis Chronica Helicis (CNCH or CNH) is a common, benign inflammatory condition of the ear cartilage and overlying skin  associated with very sensitive tender papule(s).  Trauma or pressure from sleeping on the ear or from cell phone use and sun damage may be exacerbating factors.  Treatment may include using a C-shaped airplane neck pillow for sleeping on the side of the head so no pressure is on the ear.  Other treatments include topical or intralesional steroids; liquid nitrogen or laser destruction; shave removal or excision.  The condition can be difficult to treat and persist or recur despite treatment.  Lot [id number] Exp 06/10/2024  Intralesional injection - left ear Location: left ear   Informed Consent: Discussed risks (infection, pain, bleeding, bruising, thinning of the skin, loss of skin pigment, lack of resolution, and recurrence of lesion) and benefits of the procedure, as well as the alternatives. Informed consent was obtained. Preparation: The area was prepared a standard fashion.  Anesthesia:none  Procedure Details: An intralesional injection was performed with Kenalog 5 mg/cc. 0.5 cc in total were injected.  Total number of injections: 2  Plan: The patient was instructed on post-op care. Recommend OTC analgesia as needed for pain.   Related Medications triamcinolone acetonide (KENALOG) 10 MG/ML injection 5 mg   Return in about 4 months (around 03/06/2023) for CNCH, Lichen Sclerosus .  IAngelique Holm, CMA, am acting as scribe for Armida Sans, MD .   Documentation: I have reviewed the above documentation for accuracy and completeness, and I agree with the above.  Armida Sans, MD

## 2022-11-03 NOTE — Patient Instructions (Signed)

## 2022-11-07 ENCOUNTER — Encounter: Payer: Self-pay | Admitting: Dermatology

## 2022-12-16 ENCOUNTER — Encounter: Payer: Self-pay | Admitting: Student in an Organized Health Care Education/Training Program

## 2022-12-16 ENCOUNTER — Ambulatory Visit
Payer: Medicare Other | Attending: Student in an Organized Health Care Education/Training Program | Admitting: Student in an Organized Health Care Education/Training Program

## 2022-12-16 DIAGNOSIS — M48061 Spinal stenosis, lumbar region without neurogenic claudication: Secondary | ICD-10-CM | POA: Diagnosis present

## 2022-12-16 DIAGNOSIS — G894 Chronic pain syndrome: Secondary | ICD-10-CM | POA: Insufficient documentation

## 2022-12-16 DIAGNOSIS — M4726 Other spondylosis with radiculopathy, lumbar region: Secondary | ICD-10-CM | POA: Insufficient documentation

## 2022-12-16 MED ORDER — HYDROCODONE-ACETAMINOPHEN 5-325 MG PO TABS: 1.0000 | ORAL_TABLET | Freq: Two times a day (BID) | ORAL | 0 refills | Status: AC | PRN

## 2022-12-16 MED ORDER — HYDROCODONE-ACETAMINOPHEN 5-325 MG PO TABS: 1.0000 | ORAL_TABLET | Freq: Two times a day (BID) | ORAL | 0 refills | Status: DC | PRN

## 2022-12-16 NOTE — Progress Notes (Signed)
Nursing Pain Medication Assessment:  Safety precautions to be maintained throughout the outpatient stay will include: orient to surroundings, keep bed in low position, maintain call bell within reach at all times, provide assistance with transfer out of bed and ambulation.  Medication Inspection Compliance: Pill count conducted under aseptic conditions, in front of the patient. Neither the pills nor the bottle was removed from the patient's sight at any time. Once count was completed pills were immediately returned to the patient in their original bottle.  Medication: Hydrocodone/APAP Pill/Patch Count:  15 of 75 pills remain Pill/Patch Appearance: Markings consistent with prescribed medication Bottle Appearance: Standard pharmacy container. Clearly labeled. Filled Date: 8 / 12 / 2024 Last Medication intake:  TodaySafety precautions to be maintained throughout the outpatient stay will include: orient to surroundings, keep bed in low position, maintain call bell within reach at all times, provide assistance with transfer out of bed and ambulation.

## 2022-12-16 NOTE — Progress Notes (Signed)
PROVIDER NOTE: Information contained herein reflects review and annotations entered in association with encounter. Interpretation of such information and data should be left to medically-trained personnel. Information provided to patient can be located elsewhere in the medical record under "Patient Instructions". Document created using STT-dictation technology, any transcriptional errors that may result from process are unintentional.    Patient: Caroline Campbell  Service Category: E/M  Provider: Edward Jolly, MD  DOB: 12-05-1935  DOS: 12/16/2022  Specialty: Interventional Pain Management  MRN: [id number]  Setting: Ambulatory outpatient  PCP: Lauro Regulus, MD  Type: Established Patient    Referring Provider: Lauro Regulus, MD  Location: Office  Delivery: Face-to-face     HPI  [name], a 87 y.o. year old female, is here today because of her No primary diagnosis found.. [name] primary complain today is Back Pain (lower) Last encounter: My last encounter with her was on 09/21/22 Pertinent problems: 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 Pain Assessment: Severity of Chronic pain is reported as a 6 /10. Location: Back (left knee) Right, Left/pain radiaties down her leg. Onset: More than a month ago. Quality: Aching, Burning, Constant, Throbbing. Timing: Constant. Modifying factor(s): Meds. heating pad. Vitals:  height is 5\' 1"  (1.549 m) and weight is 114 lb (51.7 kg). Her temperature is 97.4 F (36.3 C)  (abnormal). Her blood pressure is 140/62 (abnormal) and her pulse is 57 (abnormal). Her oxygen saturation is 100%.   Reason for encounter: medication management.   No change in medical history since last visit.  Patient's pain is at baseline.  Patient continues multimodal pain regimen as prescribed. States that she could benefit from taking another tablet at night before bed becomes some mornings she takes 10 mg and will not have another evening dose Increase to TID prn     Pharmacotherapy Assessment  Analgesic: 5 mg every 8 hrs PRN   Monitoring: [place] PMP: PDMP reviewed during this encounter.       Pharmacotherapy: No side-effects or adverse reactions reported. Compliance: No problems identified. Effectiveness: Clinically acceptable.  Brigitte Pulse, RN  12/16/2022  9:19 AM  Sign when Signing Visit Nursing Pain Medication Assessment:  Safety precautions to be maintained throughout the outpatient stay will include: orient to surroundings, keep bed in low position, maintain call bell within reach at all times, provide assistance with transfer out of bed and ambulation.  Medication Inspection Compliance: Pill count conducted under aseptic conditions, in front of the patient. Neither the pills nor the bottle was removed from the patient's sight at any time. Once count was completed pills were immediately returned to the patient in their original bottle.  Medication: Hydrocodone/APAP Pill/Patch Count:  15 of 75 pills remain Pill/Patch Appearance: Markings consistent with prescribed medication Bottle Appearance: Standard pharmacy container. Clearly labeled. Filled Date: 8 / 12 / 2024 Last Medication intake:  TodaySafety precautions to be maintained throughout the outpatient stay will include: orient to surroundings, keep bed in low position, maintain call bell within reach at all times, provide assistance with transfer out of bed and ambulation.    UDS:  Summary  Date Value Ref Range Status  09/21/2022 Note  Final    Comment:    ==================================================================== [employer] Select 13 (MW) ==================================================================== Test                             Result       Flag       Units  Drug Present and Declared for Prescription Verification   Hydrocodone                    3262         EXPECTED   ng/mg creat   Hydromorphone                  150          EXPECTED   ng/mg creat   Dihydrocodeine                 171          EXPECTED   ng/mg creat   Norhydrocodone                 4195         EXPECTED   ng/mg creat    Sources of hydrocodone include scheduled prescription medications.    Hydromorphone, dihydrocodeine and norhydrocodone are expected    metabolites of hydrocodone. Hydromorphone and dihydrocodeine are    also available as scheduled prescription medications.  ==================================================================== Test                      Result    Flag   Units      Ref Range   Creatinine              101              mg/dL      >=16 ==================================================================== Declared Medications:  The flagging and interpretation on this report are based on the  following declared medications.  Unexpected results may arise from  inaccuracies in the declared medications.   **Note: The testing scope of this panel includes these medications:   Hydrocodone (Norco)   **Note: The testing scope of this panel does not include the  following reported medications:   Acetaminophen  Acetaminophen (Norco)  Aspirin  Atorvastatin  Chondroitin  Clobetasol (Temovate)  Cyanocobalamin  Gabapentin (Neurontin)  Glucosamine  Metoprolol  Omega-3 Fatty Acids  Pantoprazole  Topical Lidocaine  Ubiquinone (CoQ10)  Vitamin E ==================================================================== For clinical consultation, please call [phone]. ====================================================================      ROS  Constitutional: Denies any fever or chills Gastrointestinal: No reported hemesis, hematochezia, vomiting, or acute GI distress Musculoskeletal:   low back pain Neurological: No reported episodes of acute onset apraxia, aphasia, dysarthria, agnosia, amnesia, paralysis, loss of coordination, or loss of consciousness  Medication Review  Aspirin-Acetaminophen, Coenzyme Q10, HYDROcodone-acetaminophen, Omega-3 Fatty Acids, Vitamin E Acetate, atorvastatin, clobetasol ointment, gabapentin, glucosamine-chondroitin, lidocaine, metoprolol succinate, pantoprazole, and vitamin B-12  History Review  Allergy: Ms. Muraoka is allergic to panmycin [tetracycline], crestor [rosuvastatin calcium], and dexlansoprazole. Drug: Ms. Piskor  reports no history of drug use. Alcohol:  reports current alcohol use. Tobacco:  reports that she has quit smoking. She has never used smokeless tobacco. Social: Ms. Aspinall  reports that she has quit smoking. She has never used smokeless tobacco. She reports current alcohol use. She reports that she does not use drugs. Medical:  has a past medical history of Arthritis, Cancer (HCC), GERD (gastroesophageal reflux disease),  Hyperlipidemia, Hypertension, Lumbar disc disease, and PMR (polymyalgia rheumatica) (HCC). Surgical: Ms. Colquitt  has a past surgical history that includes Abdominal hysterectomy; Colonoscopy with propofol (N/A, 09/06/2017); and Esophagogastroduodenoscopy (egd) with propofol (N/A, 09/06/2017). Family: family history is not on file.  Laboratory Chemistry Profile   Renal Lab Results  Component Value Date   BUN 12 04/15/2021   CREATININE 0.70 04/15/2021   BCR 17 04/15/2021    Hepatic Lab Results  Component Value Date   AST 20 04/15/2021   ALBUMIN 4.6 04/15/2021   ALKPHOS 72 04/15/2021    Electrolytes Lab Results  Component Value Date   NA 143 04/15/2021   K 4.6  04/15/2021   CL 106 04/15/2021   CALCIUM 9.8 04/15/2021   MG 2.1 04/15/2021    Bone Lab Results  Component Value Date   25OHVITD1 54 04/15/2021   25OHVITD2 <1.0 04/15/2021   25OHVITD3 54 04/15/2021    Inflammation (CRP: Acute Phase) (ESR: Chronic Phase) Lab Results  Component Value Date   CRP 1 04/15/2021   ESRSEDRATE 13 04/15/2021         Note: Above Lab results reviewed.  Recent Imaging Review  DG Lumbar Spine Complete W/Bend CLINICAL DATA:  Low back pain  EXAM: LUMBAR SPINE - COMPLETE WITH BENDING VIEWS  COMPARISON:  X-ray lumbar spine 05/01/2012  FINDINGS: Markedly limited evaluation due to overlapping osseous structures and overlying soft tissues.  Five non-rib-bearing lumbar vertebral bodies. Slight levocurvature of the lumbar spine centered at the L2-L3 level. Grade 1 anterolisthesis of L5 on S1. No change in alignment on flexion and extension views. Multilevel degenerative changes of the spine. There is no evidence of lumbar spine fracture. Alignment is normal. Intervertebral disc spaces are maintained. Aortic calcification.  IMPRESSION: 1. No acute displaced fracture or traumatic listhesis of the lumbar spine. 2. Grade 1 anterolisthesis of L5 on S1 with no change in alignment on extension and flexion views. 3.  Aortic Atherosclerosis (ICD10-I70.0).  Electronically Signed   By: Tish Frederickson M.D.   On: 04/15/2021 20:28 DG Thoracic Spine W/Swimmers CLINICAL DATA:  Back pain.  EXAM: THORACIC SPINE - 3 VIEWS  COMPARISON:  Chest radiograph dated 05/01/2012.  FINDINGS: No acute fracture or subluxation. The bones are osteopenic. Multilevel degenerative changes and scoliosis. Atherosclerotic calcification of the aorta. The soft tissues are unremarkable.  IMPRESSION: No acute/traumatic thoracic spine pathology.  Electronically Signed   By: Caroline Collard M.D.   On: 04/15/2021 20:22 DG Cervical Spine With Flex & Extend CLINICAL DATA:   Cervicalgia  EXAM: CERVICAL SPINE COMPLETE WITH FLEXION AND EXTENSION VIEWS  COMPARISON:  X-ray cervical spine 01/08/2013  FINDINGS: On the lateral view the cervical spine is visualized to the level of C7. Normal cervical lordosis. On flexion view there is development of grade 1 anterolisthesis of C2 on C3, C3 on C4, C4 on C5.  Dens is well positioned between the lateral masses of C1. There is limited evaluation of the dens for acute fracture on the open-mouth view due to overlying osseous structures.  Diffusely decreased bone density. Mild-to-moderate degenerative change of the spine at the C5 through C7 levels. Osseous neural foraminal stenosis at the C5-C6 level on the left. No acute displaced fracture is detected.No aggressive-appearing focal osseous lesions.  Pre-vertebral soft tissues are within normal limits.  IMPRESSION: 1. On flexion view there is development of grade 1 anterolisthesis of C2 on C3, C3 on C4, C4 on C5. 2. Mild-to-moderate degenerative changes of the C5 through C7 levels with associated  osseous neural foraminal stenosis at the C5-C6 level on the left. 3. No acute displaced fracture or traumatic listhesis of the cervical spine.  Electronically Signed   By: Tish Frederickson M.D.   On: 04/15/2021 18:46 DG HIP UNILAT W OR W/O PELVIS 2-3 VIEWS RIGHT CLINICAL DATA:  Right hip pain  EXAM: DG HIP (WITH OR WITHOUT PELVIS) 2-3V RIGHT  COMPARISON:  None.  FINDINGS: No acute fracture or dislocation identified in the right hip. Moderate joint space narrowing with acetabular subchondral sclerosis and small marginal osteophyte formation. No acute fracture identified in the pelvis. Bones are osteopenic.  IMPRESSION: Chronic changes with no acute osseous abnormality identified.  Electronically Signed   By: Jannifer Hick M.D.   On: 04/15/2021 16:45 DG Shoulder Right CLINICAL DATA:  Right shoulder pain  EXAM: RIGHT SHOULDER - 2+ VIEW  COMPARISON:   None.  FINDINGS: No acute fracture or dislocation identified. Mild joint space narrowing. No suspicious bony lesions identified.  IMPRESSION: No acute osseous abnormality identified.  Electronically Signed   By: Jannifer Hick M.D.   On: 04/15/2021 16:44 Note: Reviewed        Physical Exam  General appearance: Well nourished, well developed, and well hydrated. In no apparent acute distress Mental status: Alert, oriented x 3 (person, place, & time)       Respiratory: No evidence of acute respiratory distress Eyes: PERLA Vitals: BP (!) 140/62   Pulse (!) 57   Temp (!) 97.4 F (36.3 C)   Ht 5\' 1"  (1.549 m)   Wt 114 lb (51.7 kg)   SpO2 100%   BMI 21.54 kg/m  BMI: Estimated body mass index is 21.54 kg/m as calculated from the following:   Height as of this encounter: 5\' 1"  (1.549 m).   Weight as of this encounter: 114 lb (51.7 kg). Ideal: Ideal body weight: 47.8 kg (105 lb 6.1 oz) Adjusted ideal body weight: 49.4 kg (108 lb 13.2 oz)   Lumbar Spine Area Exam  Skin & Axial Inspection: No masses, redness, or swelling Alignment: Symmetrical Functional ROM: Pain restricted ROM affecting both sides Stability: No instability detected Muscle Tone/Strength: Functionally intact. No obvious neuro-muscular anomalies detected. Sensory (Neurological): Dermatomal pain pattern and MSK   Lower Extremity Exam      Side: Right lower extremity   Side: Left lower extremity  Stability: No instability observed           Stability: No instability observed          Skin & Extremity Inspection: Skin color, temperature, and hair growth are WNL. No peripheral edema or cyanosis. No masses, redness, swelling, asymmetry, or associated skin lesions. No contractures.   Skin & Extremity Inspection: Skin color, temperature, and hair growth are WNL. No peripheral edema or cyanosis. No masses, redness, swelling, asymmetry, or associated skin lesions. No contractures.  Functional ROM: Pain restricted ROM for  hip joint           Functional ROM: Pain restricted ROM for hip joint          Muscle Tone/Strength: Functionally intact. No obvious neuro-muscular anomalies detected.   Muscle Tone/Strength: Functionally intact. No obvious neuro-muscular anomalies detected.  Sensory (Neurological): Musculoskeletal pain pattern         Sensory (Neurological): Musculoskeletal pain pattern        DTR: Patellar: deferred today Achilles: deferred today Plantar: deferred today   DTR: Patellar: deferred today Achilles: deferred today Plantar: deferred today  Palpation: No palpable anomalies   Palpation:  No palpable anomalies     Assessment   Status Diagnosis  Controlled Controlled Controlled 1. Chronic pain syndrome   2. 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 Osteoarthritis of spine with radiculopathy, lumbar region          Plan of Care    Ms. Caroline Red Madole has a current medication list which includes the following long-term medication(s): atorvastatin, gabapentin, metoprolol succinate, and pantoprazole.  Pharmacotherapy (Medications Ordered): Meds ordered this encounter  Medications   HYDROcodone-acetaminophen (NORCO/VICODIN) 5-325 MG tablet    Sig: Take 1-2 tablets by mouth every 12 (twelve) hours as needed for severe pain. Must last 30 days.    Dispense:  90 tablet    Refill:  0    Chronic Pain: STOP Act (Not applicable) Fill 1 day early if closed on refill date. Avoid benzodiazepines within 8 hours of opioids   HYDROcodone-acetaminophen (NORCO/VICODIN) 5-325 MG tablet    Sig: Take 1-2 tablets by mouth every 12 (twelve) hours as needed for severe pain. Must last 30 days.    Dispense:  90 tablet    Refill:  0    Chronic Pain: STOP Act (Not applicable) Fill 1 day early if closed on refill date. Avoid benzodiazepines within 8 hours of opioids   HYDROcodone-acetaminophen (NORCO/VICODIN) 5-325 MG tablet    Sig: Take 1-2 tablets by mouth every 12 (twelve) hours as needed for severe  pain. Must last 30 days.    Dispense:  90 tablet    Refill:  0    Chronic Pain: STOP Act (Not applicable) Fill 1 day early if closed on refill date. Avoid benzodiazepines within 8 hours of opioids    No orders of the defined types were placed in this encounter.   Continue gabapentin 300 mg nightly, lidocaine patch as needed, managed by PCP. Discussed aspercreme for knee pain  Follow-up plan:   Return in about 3 months (around 03/17/2023).    Recent Visits Date Type Provider Dept  09/21/22 Office Visit Edward Jolly, MD [facility]  Showing recent visits within past 90 days and meeting all other requirements Today's Visits Date Type Provider Dept  12/16/22 Office Visit Edward Jolly, MD [facility]  Showing today's visits and meeting all other requirements Future Appointments No visits were found meeting these conditions. Showing future appointments within next 90 days and meeting all other requirements  I discussed the assessment and treatment plan with the patient. The patient was provided an opportunity to ask questions and all were answered. The patient agreed with the plan and demonstrated an understanding of the instructions.  Patient advised to call back or seek an in-person evaluation if the symptoms or condition worsens.  Duration of encounter: .

## 2022-12-16 NOTE — Patient Instructions (Signed)
Try Aspercreme or Voltaren gel

## 2023-02-17 ENCOUNTER — Encounter: Payer: Self-pay | Admitting: Dermatology

## 2023-02-17 ENCOUNTER — Ambulatory Visit: Payer: Medicare Other | Admitting: Dermatology

## 2023-02-17 DIAGNOSIS — Z79899 Other long term (current) drug therapy: Secondary | ICD-10-CM

## 2023-02-17 DIAGNOSIS — L578 Other skin changes due to chronic exposure to nonionizing radiation: Secondary | ICD-10-CM | POA: Diagnosis not present

## 2023-02-17 DIAGNOSIS — L900 Lichen sclerosus et atrophicus: Secondary | ICD-10-CM | POA: Diagnosis not present

## 2023-02-17 DIAGNOSIS — H61002 Unspecified perichondritis of left external ear: Secondary | ICD-10-CM | POA: Diagnosis not present

## 2023-02-17 DIAGNOSIS — W908XXA Exposure to other nonionizing radiation, initial encounter: Secondary | ICD-10-CM

## 2023-02-17 DIAGNOSIS — Z7189 Other specified counseling: Secondary | ICD-10-CM

## 2023-02-17 MED ORDER — CLOBETASOL PROPIONATE 0.05 % EX OINT: 1.0000 | TOPICAL_OINTMENT | CUTANEOUS | 5 refills | Status: AC

## 2023-02-17 NOTE — Patient Instructions (Signed)
Treatment Plan: Continue clobetasol 0.05% ointment every day 5d/wk. Avoid applying to face, groin, and axilla. Use as directed. Long-term use can cause thinning of the skin.  Due to recent changes in healthcare laws, you may see results of your pathology and/or laboratory studies on MyChart before the doctors have had a chance to review them. We understand that in some cases there may be results that are confusing or concerning to you. Please understand that not all results are received at the same time and often the doctors may need to interpret multiple results in order to provide you with the best plan of care or course of treatment. Therefore, we ask that you please give [place] 2 business days to thoroughly review all your results before contacting the office for clarification. Should we see a critical lab result, you will be contacted sooner.   If You Need Anything After Your Visit  If you have any questions or concerns for your doctor, please call our main line at [phone] and press option 4 to reach your doctor's medical assistant. If no one answers, please leave a voicemail as directed and we will return your call as soon as possible. Messages left after 4 pm will be answered the following business day.   You may also send [place] a message via MyChart. We typically respond to MyChart messages within 1-2 business days.  For prescription refills, please ask your pharmacy to contact our office. Our fax number is [phone].  If you have an urgent issue when the clinic is closed that cannot wait until the next business day, you can page your doctor at the number below.    Please note that while we do our best to be available for urgent issues outside of office hours, we are not available 24/7.   If you have an urgent issue and are unable to reach [place], you may choose to seek medical care at your doctor's office, retail clinic, urgent care center, or emergency room.  If you have a medical  emergency, please immediately call 911 or go to the emergency department.  Pager Numbers  - Dr. Gwen Pounds: [phone]  - Dr. Roseanne Reno: [phone]  - Dr. Katrinka Blazing: [phone]   In the event of inclement weather, please call our main line at [phone] for an update on the status of any delays or closures.  Dermatology Medication Tips: Please keep the boxes that topical medications come in in order to help keep track of the instructions about where and how to use these. Pharmacies typically print the medication instructions only on the boxes and not directly on the medication tubes.   If your medication is too expensive, please contact our office at [phone] option 4 or send [place] a message through MyChart.   We are unable to tell what your co-pay for medications will be in advance as this is different depending on your insurance coverage. However, we may be able to find a substitute medication at lower cost or fill out paperwork to get insurance to cover a needed medication.   If a prior authorization is required to get your medication covered by your insurance company, please allow [place] 1-2 business days to complete this process.  Drug prices often vary depending on where the prescription is filled and some pharmacies may offer cheaper prices.  The website www.goodrx.com contains coupons for medications through different pharmacies. The prices here do not account for what the cost may be with help from insurance (it may be cheaper with  your insurance), but the website can give you the price if you did not use any insurance.  - You can print the associated coupon and take it with your prescription to the pharmacy.  - You may also stop by our office during regular business hours and pick up a GoodRx coupon card.  - If you need your prescription sent electronically to a different pharmacy, notify our office through [facility] or by phone at [phone] option 4.     Si Usted  Necesita Algo Despus de Su Visita  Tambin puede enviarnos un mensaje a travs de Clinical cytogeneticist. Por lo general respondemos a los mensajes de MyChart en el transcurso de 1 a 2 das hbiles.  Para renovar recetas, por favor pida a su farmacia que se ponga en contacto con nuestra oficina. Annie Sable de fax es [place] [phone].  Si tiene un asunto urgente cuando la clnica est cerrada y que no puede esperar hasta el siguiente da hbil, puede llamar/localizar a su doctor(a) al nmero que aparece a continuacin.   Por favor, tenga en cuenta que aunque hacemos todo lo posible para estar disponibles para asuntos urgentes fuera del horario de [place], no estamos disponibles las 24 horas del da, los 7 [address]  [address] de la [place].   Si tiene un problema urgente y no puede comunicarse con nosotros, puede optar por buscar atencin mdica  en el consultorio de su doctor(a), en una clnica privada, en un centro de atencin urgente o en una sala de emergencias.  Si tiene Engineer, drilling, por favor llame inmediatamente al 911 o vaya a la sala de emergencias.  Nmeros de bper  - Dr. Gwen Pounds: [phone]  - Dra. Roseanne Reno: [phone]  - Dr. Katrinka Blazing: [phone]   En caso de inclemencias del tiempo, por favor llame a Lacy Duverney principal al [phone] para una actualizacin sobre el [place] de cualquier retraso o cierre.  Consejos para la medicacin en dermatologa: Por favor, guarde las cajas en las que vienen los medicamentos de uso tpico para ayudarle a seguir las instrucciones sobre dnde y cmo usarlos. Las farmacias generalmente imprimen las instrucciones del medicamento slo en las cajas y no directamente en los tubos del [place].   Si su medicamento es muy caro, por favor, pngase en contacto con Rolm Gala llamando al [phone] y presione la opcin 4 o envenos un mensaje a travs de Clinical cytogeneticist.   No podemos decirle cul ser su copago por los medicamentos por adelantado ya que esto  es diferente dependiendo de la cobertura de su seguro. Sin embargo, es posible que podamos encontrar un medicamento sustituto a Audiological scientist un formulario para que el seguro cubra el medicamento que se considera necesario.   Si se requiere una autorizacin previa para que su compaa de seguros [place] su medicamento, por favor permtanos de 1 a 2 das hbiles para completar [address]  Los precios de los medicamentos varan con frecuencia dependiendo del Environmental consultant de dnde se surte la receta y alguna farmacias pueden ofrecer precios ms baratos.  El sitio web www.goodrx.com tiene cupones para medicamentos de Health and safety inspector. Los precios aqu no tienen en cuenta lo que podra costar con la ayuda del seguro (puede ser ms barato con su seguro), pero el sitio web puede darle el precio si no utiliz Tourist information centre manager.  - Puede imprimir el cupn correspondiente y llevarlo con su receta a la farmacia.  - Tambin puede pasar por nuestra oficina durante el horario de atencin regular y Librarian, academic  tarjeta de cupones de GoodRx.  - Si necesita que su receta se enve electrnicamente a una farmacia diferente, informe a nuestra oficina a travs de MyChart de [place] o por telfono llamando al [phone] y presione la opcin 4.

## 2023-02-17 NOTE — Progress Notes (Signed)
   Follow-Up Visit   Subjective  Caroline Campbell is a 87 y.o. female who presents for the following: lichen sclerosus at vaginal area. Using clobetasol ointment 5 days a week. If she missed a day she will have burning.  Also following up for [facility] at left ear, had ILK injections at last appt, patient advises improved.   The patient has spots, moles and lesions to be evaluated, some may be new or changing and the patient may have concern these could be cancer.   The following portions of the chart were reviewed this encounter and updated as appropriate: medications, allergies, medical history  Review of Systems:  No other skin or systemic complaints except as noted in HPI or Assessment and Plan.  Objective  Well appearing patient in no apparent distress; mood and affect are within normal limits.   A focused examination was performed of the following areas: Left ear, groin  Relevant exam findings are noted in the Assessment and Plan.    Assessment & Plan   CHONDRODERMATITIS NODULARIS HELICIS Exam: Scaly pink papule or plaque overlying prominent cartilage at the left ear  Chondrodermatitis Nodularis Chronica Helicis (CNCH or CNH) is a common, benign inflammatory condition of the ear cartilage and overlying skin associated with very sensitive tender papule(s).  Trauma or pressure from sleeping on the ear or from cell phone use and sun damage may be exacerbating factors.  Treatment may include using a C-shaped airplane neck pillow for sleeping on the side of the head so no pressure is on the ear.  Other treatments include topical or intralesional steroids; liquid nitrogen or laser destruction; shave removal or excision.  The condition can be difficult to treat and persist or recur despite treatment.  Treatment Plan: None today, asymptomatic  LICHEN SCLEROSUS ET ATROPHICUS Exam: mild pinkness, no evidence of atrophy or bruising  Chronic condition with duration or expected duration over  one year. Currently well-controlled.  Lichen sclerosus is a chronic inflammatory condition of unknown cause that frequently involves the vaginal area and less commonly extragenital skin, and is NOT sexually transmitted. It frequently causes symptoms of pain and burning.  It requires regular monitoring and treatment with topical steroids to minimize inflammation and to reduce risk of scarring. There is also a risk of cancer in the vaginal area which is very low if inflammation is well controlled. Regular checks of the area are recommended. Please call if you notice any new or changing spots within this area.  Treatment Plan: Continue clobetasol 0.05% ointment every day 5d/wk. Avoid applying to face, groin, and axilla. Use as directed. Long-term use can cause thinning of the skin.  ACTINIC DAMAGE - chronic, secondary to cumulative UV radiation exposure/sun exposure over time - diffuse scaly erythematous macules with underlying dyspigmentation - Recommend daily broad spectrum sunscreen SPF 30+ to sun-exposed areas, reapply every 2 hours as needed.  - Recommend staying in the shade or wearing long sleeves, sun glasses (UVA+UVB protection) and wide brim hats (4-inch brim around the entire circumference of the hat). - Call for new or changing lesions.   Return in about 1 year (around 02/17/2024) for LSetA.  Anise Salvo, RMA, am acting as scribe for Armida Sans, MD .   Documentation: I have reviewed the above documentation for accuracy and completeness, and I agree with the above.  Armida Sans, MD

## 2023-03-17 ENCOUNTER — Ambulatory Visit
Payer: Medicare Other | Attending: Student in an Organized Health Care Education/Training Program | Admitting: Student in an Organized Health Care Education/Training Program

## 2023-03-17 ENCOUNTER — Encounter: Payer: Self-pay | Admitting: Student in an Organized Health Care Education/Training Program

## 2023-03-17 DIAGNOSIS — M48061 Spinal stenosis, lumbar region without neurogenic claudication: Secondary | ICD-10-CM | POA: Insufficient documentation

## 2023-03-17 DIAGNOSIS — M4726 Other spondylosis with radiculopathy, lumbar region: Secondary | ICD-10-CM | POA: Insufficient documentation

## 2023-03-17 DIAGNOSIS — G894 Chronic pain syndrome: Secondary | ICD-10-CM | POA: Diagnosis present

## 2023-03-17 MED ORDER — HYDROCODONE-ACETAMINOPHEN 5-325 MG PO TABS: 1.0000 | ORAL_TABLET | Freq: Two times a day (BID) | ORAL | 0 refills | Status: AC | PRN

## 2023-03-17 MED ORDER — HYDROCODONE-ACETAMINOPHEN 5-325 MG PO TABS: 1.0000 | ORAL_TABLET | Freq: Two times a day (BID) | ORAL | 0 refills | Status: DC | PRN

## 2023-03-17 NOTE — Progress Notes (Signed)
Nursing Pain Medication Assessment:  Safety precautions to be maintained throughout the outpatient stay will include: orient to surroundings, keep bed in low position, maintain call bell within reach at all times, provide assistance with transfer out of bed and ambulation.  Medication Inspection Compliance: Pill count conducted under aseptic conditions, in front of the patient. Neither the pills nor the bottle was removed from the patient's sight at any time. Once count was completed pills were immediately returned to the patient in their original bottle.  Medication: Hydrocodone/APAP Pill/Patch Count:  9 of 90 pills remain Pill/Patch Appearance: Markings consistent with prescribed medication Bottle Appearance: Standard pharmacy container. Clearly labeled. Filled Date: 23 / 8 / 2024 Last Medication intake:  TodaySafety precautions to be maintained throughout the outpatient stay will include: orient to surroundings, keep bed in low position, maintain call bell within reach at all times, provide assistance with transfer out of bed and ambulation.

## 2023-03-17 NOTE — Progress Notes (Signed)
PROVIDER NOTE: Information contained herein reflects review and annotations entered in association with encounter. Interpretation of such information and data should be left to medically-trained personnel. Information provided to patient can be located elsewhere in the medical record under "Patient Instructions". Document created using STT-dictation technology, any transcriptional errors that may result from process are unintentional.    Patient: Caroline Campbell  Service Category: E/M  Provider: Edward Jolly, MD  DOB: 05/28/1935  DOS: 03/17/2023  Specialty: Interventional Pain Management  MRN: [id number]  Setting: Ambulatory outpatient  PCP: Lauro Regulus, MD  Type: Established Patient    Referring Provider: Lauro Regulus, MD  Location: Office  Delivery: Face-to-face     HPI  Caroline Campbell, a 87 y.o. year old female, is here today because of her No primary diagnosis found.. Caroline Campbell primary complain today is Back Pain (lower) Last encounter: My last encounter with her was on 12/16/22 Pertinent problems: 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 Pain Assessment: Severity of Chronic pain is reported as a 5 /10. Location: Back Right, Left/pain radiaties down to her right hip. Onset: More than a month ago. Quality: Aching, Burning, Constant, Discomfort. Timing: Constant. Modifying factor(s): meds. Vitals:  height is 5\' 2"  (1.575 m) and weight is 114 lb (51.7 kg). Her temperature is 97.3 F (36.3 C) (abnormal). Her blood  pressure is 161/101 (abnormal) and her pulse is 71. Her oxygen saturation is 100%.   Reason for encounter: medication management.   No change in medical history since last visit.  Patient's pain is at baseline.  Patient continues multimodal pain regimen as prescribed.  States that it provides pain relief and improvement in functional status.   Pharmacotherapy Assessment  Analgesic: Hydrocodone 5 mg every 8 hrs PRN   Monitoring: [place] PMP: PDMP reviewed during this encounter.       Pharmacotherapy: No side-effects or adverse reactions reported. Compliance: No problems identified. Effectiveness: Clinically acceptable.  Brigitte Pulse, RN  03/17/2023  9:56 AM  Sign when Signing Visit Nursing Pain Medication Assessment:  Safety precautions to be maintained throughout the outpatient stay will include: orient to surroundings, keep bed in low position, maintain call bell within reach at all times, provide assistance with transfer out of bed and ambulation.  Medication Inspection Compliance: Pill count conducted under aseptic conditions, in front of the patient. Neither the pills nor the bottle was removed from the patient's sight at any time. Once count was completed pills were immediately returned to the patient in their original bottle.  Medication: Hydrocodone/APAP Pill/Patch Count:  9 of 90 pills remain Pill/Patch Appearance: Markings consistent with prescribed medication Bottle Appearance: Standard pharmacy container. Clearly labeled. Filled Date: 60 / 8 / 2024 Last Medication intake:  TodaySafety precautions to be maintained throughout the outpatient stay will include: orient to surroundings, keep bed in low position, maintain call bell within reach at all times, provide assistance with transfer out of bed and ambulation.      UDS:  Summary  Date Value Ref Range Status  09/21/2022 Note  Final    Comment:    ==================================================================== [employer]  Select 13 (MW) ==================================================================== Test  Result       Flag       Units  Drug Present and Declared for Prescription Verification   Hydrocodone                    3262         EXPECTED   ng/mg creat   Hydromorphone                  150          EXPECTED   ng/mg creat   Dihydrocodeine                 171          EXPECTED   ng/mg creat   Norhydrocodone                 4195         EXPECTED   ng/mg creat    Sources of hydrocodone include scheduled prescription medications.    Hydromorphone, dihydrocodeine and norhydrocodone are expected    metabolites of hydrocodone. Hydromorphone and dihydrocodeine are    also available as scheduled prescription medications.  ==================================================================== Test                      Result    Flag   Units      Ref Range   Creatinine              101              mg/dL      >=10 ==================================================================== Declared Medications:  The flagging and interpretation on this report are based on the  following declared medications.  Unexpected results may arise from  inaccuracies in the declared medications.   **Note: The testing scope of this panel includes these medications:   Hydrocodone (Norco)   **Note: The testing scope of this panel does not include the  following reported medications:   Acetaminophen  Acetaminophen (Norco)  Aspirin  Atorvastatin  Chondroitin  Clobetasol (Temovate)  Cyanocobalamin  Gabapentin (Neurontin)  Glucosamine  Metoprolol  Omega-3 Fatty Acids  Pantoprazole  Topical Lidocaine  Ubiquinone (CoQ10)  Vitamin E ==================================================================== For clinical consultation, please call [phone]. ====================================================================      ROS  Constitutional: Denies any fever or  chills Gastrointestinal: No reported hemesis, hematochezia, vomiting, or acute GI distress Musculoskeletal:   low back pain Neurological: No reported episodes of acute onset apraxia, aphasia, dysarthria, agnosia, amnesia, paralysis, loss of coordination, or loss of consciousness  Medication Review  Aspirin-Acetaminophen, Coenzyme Q10, HYDROcodone-acetaminophen, Omega-3 Fatty Acids, Vitamin E Acetate, atorvastatin, clobetasol ointment, gabapentin, glucosamine-chondroitin, lidocaine, metoprolol succinate, pantoprazole, and vitamin B-12  History Review  Allergy: Ms. Schwaderer is allergic to panmycin [tetracycline], crestor [rosuvastatin calcium], and dexlansoprazole. Drug: Ms. Arciero  reports no history of drug use. Alcohol:  reports current alcohol use. Tobacco:  reports that she has quit smoking. She has never used smokeless tobacco. Social: Ms. Roffman  reports that she has quit smoking. She has never used smokeless tobacco. She reports current alcohol use. She reports that she does not use drugs. Medical:  has a past medical history of Arthritis, Cancer (HCC), GERD (gastroesophageal reflux disease), Hyperlipidemia, Hypertension, Lumbar disc disease, and PMR (polymyalgia rheumatica) (HCC). Surgical: Ms. Schallhorn  has a past surgical history that includes Abdominal hysterectomy; Colonoscopy with propofol (N/A, 09/06/2017); and Esophagogastroduodenoscopy (egd) with propofol (N/A, 09/06/2017). Family: family history is not on file.  Laboratory Chemistry Profile  Renal Lab Results  Component Value Date   BUN 12 04/15/2021   CREATININE 0.70 04/15/2021   BCR 17 04/15/2021    Hepatic Lab Results  Component Value Date   AST 20 04/15/2021   ALBUMIN 4.6 04/15/2021   ALKPHOS 72 04/15/2021    Electrolytes Lab Results  Component Value Date   NA 143 04/15/2021   K 4.6 04/15/2021   CL 106 04/15/2021   CALCIUM 9.8 04/15/2021   MG 2.1 04/15/2021    Bone Lab Results  Component Value Date    25OHVITD1 54 04/15/2021   25OHVITD2 <1.0 04/15/2021   25OHVITD3 54 04/15/2021    Inflammation (CRP: Acute Phase) (ESR: Chronic Phase) Lab Results  Component Value Date   CRP 1 04/15/2021   ESRSEDRATE 13 04/15/2021         Note: Above Lab results reviewed.  Recent Imaging Review  DG Lumbar Spine Complete W/Bend CLINICAL DATA:  Low back pain  EXAM: LUMBAR SPINE - COMPLETE WITH BENDING VIEWS  COMPARISON:  X-ray lumbar spine 05/01/2012  FINDINGS: Markedly limited evaluation due to overlapping osseous structures and overlying soft tissues.  Five non-rib-bearing lumbar vertebral bodies. Slight levocurvature of the lumbar spine centered at the L2-L3 level. Grade 1 anterolisthesis of L5 on S1. No change in alignment on flexion and extension views. Multilevel degenerative changes of the spine. There is no evidence of lumbar spine fracture. Alignment is normal. Intervertebral disc spaces are maintained. Aortic calcification.  IMPRESSION: 1. No acute displaced fracture or traumatic listhesis of the lumbar spine. 2. Grade 1 anterolisthesis of L5 on S1 with no change in alignment on extension and flexion views. 3.  Aortic Atherosclerosis (ICD10-I70.0).  Electronically Signed   By: Tish Frederickson M.D.   On: 04/15/2021 20:28 DG Thoracic Spine W/Swimmers CLINICAL DATA:  Back pain.  EXAM: THORACIC SPINE - 3 VIEWS  COMPARISON:  Chest radiograph dated 05/01/2012.  FINDINGS: No acute fracture or subluxation. The bones are osteopenic. Multilevel degenerative changes and scoliosis. Atherosclerotic calcification of the aorta. The soft tissues are unremarkable.  IMPRESSION: No acute/traumatic thoracic spine pathology.  Electronically Signed   By: Elgie Collard M.D.   On: 04/15/2021 20:22 DG Cervical Spine With Flex & Extend CLINICAL DATA:  Cervicalgia  EXAM: CERVICAL SPINE COMPLETE WITH FLEXION AND EXTENSION VIEWS  COMPARISON:  X-ray cervical spine  01/08/2013  FINDINGS: On the lateral view the cervical spine is visualized to the level of C7. Normal cervical lordosis. On flexion view there is development of grade 1 anterolisthesis of C2 on C3, C3 on C4, C4 on C5.  Dens is well positioned between the lateral masses of C1. There is limited evaluation of the dens for acute fracture on the open-mouth view due to overlying osseous structures.  Diffusely decreased bone density. Mild-to-moderate degenerative change of the spine at the C5 through C7 levels. Osseous neural foraminal stenosis at the C5-C6 level on the left. No acute displaced fracture is detected.No aggressive-appearing focal osseous lesions.  Pre-vertebral soft tissues are within normal limits.  IMPRESSION: 1. On flexion view there is development of grade 1 anterolisthesis of C2 on C3, C3 on C4, C4 on C5. 2. Mild-to-moderate degenerative changes of the C5 through C7 levels with associated osseous neural foraminal stenosis at the C5-C6 level on the left. 3. No acute displaced fracture or traumatic listhesis of the cervical spine.  Electronically Signed   By: Tish Frederickson M.D.   On: 04/15/2021 18:46 DG HIP UNILAT W OR W/O PELVIS 2-3 VIEWS RIGHT  CLINICAL DATA:  Right hip pain  EXAM: DG HIP (WITH OR WITHOUT PELVIS) 2-3V RIGHT  COMPARISON:  None.  FINDINGS: No acute fracture or dislocation identified in the right hip. Moderate joint space narrowing with acetabular subchondral sclerosis and small marginal osteophyte formation. No acute fracture identified in the pelvis. Bones are osteopenic.  IMPRESSION: Chronic changes with no acute osseous abnormality identified.  Electronically Signed   By: Jannifer Hick M.D.   On: 04/15/2021 16:45 DG Shoulder Right CLINICAL DATA:  Right shoulder pain  EXAM: RIGHT SHOULDER - 2+ VIEW  COMPARISON:  None.  FINDINGS: No acute fracture or dislocation identified. Mild joint space narrowing. No suspicious bony  lesions identified.  IMPRESSION: No acute osseous abnormality identified.  Electronically Signed   By: Jannifer Hick M.D.   On: 04/15/2021 16:44 Note: Reviewed        Physical Exam  General appearance: Well nourished, well developed, and well hydrated. In no apparent acute distress Mental status: Alert, oriented x 3 (person, place, & time)       Respiratory: No evidence of acute respiratory distress Eyes: PERLA Vitals: BP (!) 161/101   Pulse 71   Temp (!) 97.3 F (36.3 C)   Ht 5\' 2"  (1.575 m)   Wt 114 lb (51.7 kg)   SpO2 100%   BMI 20.85 kg/m  BMI: Estimated body mass index is 20.85 kg/m as calculated from the following:   Height as of this encounter: 5\' 2"  (1.575 m).   Weight as of this encounter: 114 lb (51.7 kg). Ideal: Ideal body weight: 50.1 kg (110 lb 7.2 oz) Adjusted ideal body weight: 50.7 kg (111 lb 13.9 oz)   Lumbar Spine Area Exam  Skin & Axial Inspection: No masses, redness, or swelling Alignment: Symmetrical Functional ROM: Pain restricted ROM affecting both sides Stability: No instability detected Muscle Tone/Strength: Functionally intact. No obvious neuro-muscular anomalies detected. Sensory (Neurological): Dermatomal pain pattern and MSK   Lower Extremity Exam      Side: Right lower extremity   Side: Left lower extremity  Stability: No instability observed           Stability: No instability observed          Skin & Extremity Inspection: Skin color, temperature, and hair growth are WNL. No peripheral edema or cyanosis. No masses, redness, swelling, asymmetry, or associated skin lesions. No contractures.   Skin & Extremity Inspection: Skin color, temperature, and hair growth are WNL. No peripheral edema or cyanosis. No masses, redness, swelling, asymmetry, or associated skin lesions. No contractures.  Functional ROM: Pain restricted ROM for hip joint           Functional ROM: Pain restricted ROM for hip joint          Muscle Tone/Strength:  Functionally intact. No obvious neuro-muscular anomalies detected.   Muscle Tone/Strength: Functionally intact. No obvious neuro-muscular anomalies detected.  Sensory (Neurological): Musculoskeletal pain pattern         Sensory (Neurological): Musculoskeletal pain pattern        DTR: Patellar: deferred today Achilles: deferred today Plantar: deferred today   DTR: Patellar: deferred today Achilles: deferred today Plantar: deferred today  Palpation: No palpable anomalies   Palpation: No palpable anomalies     Assessment   Status Diagnosis  Controlled Controlled Controlled 1. Chronic pain syndrome   2. 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 Osteoarthritis of spine with radiculopathy, lumbar region           Plan  of Care    Ms. Caroline Red Dansereau has a current medication list which includes the following long-term medication(s): atorvastatin, gabapentin, metoprolol succinate, and pantoprazole.  Pharmacotherapy (Medications Ordered): Meds ordered this encounter  Medications   HYDROcodone-acetaminophen (NORCO/VICODIN) 5-325 MG tablet    Sig: Take 1-2 tablets by mouth every 12 (twelve) hours as needed for severe pain (pain score 7-10). Must last 30 days.    Dispense:  90 tablet    Refill:  0    Chronic Pain: STOP Act (Not applicable) Fill 1 day early if closed on refill date. Avoid benzodiazepines within 8 hours of opioids   HYDROcodone-acetaminophen (NORCO/VICODIN) 5-325 MG tablet    Sig: Take 1-2 tablets by mouth every 12 (twelve) hours as needed for severe pain (pain score 7-10). Must last 30 days.    Dispense:  90 tablet    Refill:  0    Chronic Pain: STOP Act (Not applicable) Fill 1 day early if closed on refill date. Avoid benzodiazepines within 8 hours of opioids   HYDROcodone-acetaminophen (NORCO/VICODIN) 5-325 MG tablet    Sig: Take 1-2 tablets by mouth every 12 (twelve) hours as needed for severe pain (pain score 7-10). Must last 30 days.    Dispense:  90 tablet     Refill:  0    Chronic Pain: STOP Act (Not applicable) Fill 1 day early if closed on refill date. Avoid benzodiazepines within 8 hours of opioids      Follow-up plan:   Return in about 13 weeks (around 06/16/2023) for MM, F2F.    Recent Visits No visits were found meeting these conditions. Showing recent visits within past 90 days and meeting all other requirements Today's Visits Date Type Provider Dept  03/17/23 Office Visit Edward Jolly, MD [facility]  Showing today's visits and meeting all other requirements Future Appointments Date Type Provider Dept  06/14/23 Appointment Edward Jolly, MD [facility]  Showing future appointments within next 90 days and meeting all other requirements  I discussed the assessment and treatment plan with the patient. The patient was provided an opportunity to ask questions and all were answered. The patient agreed with the plan and demonstrated an understanding of the instructions.  Patient advised to call back or seek an in-person evaluation if the symptoms or condition worsens.  Duration of encounter: .

## 2023-06-14 ENCOUNTER — Ambulatory Visit
Payer: Medicare Other | Attending: Student in an Organized Health Care Education/Training Program | Admitting: Student in an Organized Health Care Education/Training Program

## 2023-06-14 ENCOUNTER — Encounter: Payer: Self-pay | Admitting: Student in an Organized Health Care Education/Training Program

## 2023-06-14 DIAGNOSIS — G894 Chronic pain syndrome: Secondary | ICD-10-CM | POA: Diagnosis not present

## 2023-06-14 DIAGNOSIS — M4726 Other spondylosis with radiculopathy, lumbar region: Secondary | ICD-10-CM | POA: Diagnosis present

## 2023-06-14 DIAGNOSIS — M48061 Spinal stenosis, lumbar region without neurogenic claudication: Secondary | ICD-10-CM | POA: Diagnosis present

## 2023-06-14 MED ORDER — HYDROCODONE-ACETAMINOPHEN 5-325 MG PO TABS: 1.0000 | ORAL_TABLET | Freq: Two times a day (BID) | ORAL | 0 refills | Status: AC | PRN

## 2023-06-14 MED ORDER — HYDROCODONE-ACETAMINOPHEN 5-325 MG PO TABS: 1.0000 | ORAL_TABLET | Freq: Two times a day (BID) | ORAL | 0 refills | Status: DC | PRN

## 2023-06-14 NOTE — Progress Notes (Signed)
 PROVIDER NOTE: Information contained herein reflects review and annotations entered in association with encounter. Interpretation of such information and data should be left to medically-trained personnel. Information provided to patient can be located elsewhere in the medical record under "Patient Instructions". Document created using STT-dictation technology, any transcriptional errors that may result from process are unintentional.    Patient: Caroline Campbell  Service Category: E/M  Provider: Edward Jolly, MD  DOB: Dec 15, 1935  DOS: 06/14/2023  Specialty: Interventional Pain Management  MRN: [id number]  Setting: Ambulatory outpatient  PCP: Lauro Regulus, MD  Type: Established Patient    Referring Provider: Lauro Regulus, MD  Location: Office  Delivery: Face-to-face     HPI  Caroline Campbell, a 88 y.o. year old female, is here today because of her No primary diagnosis found.. Ms. 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 Pain Assessment: Severity of Chronic pain is reported as a 4 /10. Location: Hip Right/Denies. Onset: More than a month ago. Quality: Aching, Constant, Throbbing. Timing: Constant. Modifying factor(s): meds, heat and patches. Vitals:  height is 5\' 2"  (1.575 m) and weight is 114 lb (51.7 kg). Her temperature is 98.4 F (36.9 C). Her blood pressure is 170/60 (abnormal) and her pulse  is 64. Her oxygen saturation is 100%.   Reason for encounter: medication management.   No change in medical history since last visit.  Patient's pain is at baseline.  Patient continues multimodal pain regimen as prescribed.  States that it provides pain relief and improvement in functional status.   Pharmacotherapy Assessment  Analgesic: Hydrocodone 5 mg every 8 hrs PRN   Monitoring: [place] PMP: PDMP not reviewed this encounter.       Pharmacotherapy: No side-effects or adverse reactions reported. Compliance: No problems identified. Effectiveness: Clinically acceptable.  Brigitte Pulse, RN  06/14/2023  8:55 AM  Sign when Signing Visit Nursing Pain Medication Assessment:  Safety precautions to be maintained throughout the outpatient stay will include: orient to surroundings, keep bed in low position, maintain call bell within reach at all times, provide assistance with transfer out of bed and ambulation.  Medication Inspection Compliance: Pill count conducted under aseptic conditions, in front of the patient. Neither the pills nor the bottle was removed from the patient's sight at any time. Once count was completed pills were immediately returned to the patient in their original bottle.  Medication: Hydrocodone/APAP Pill/Patch Count:  6 of 90 pills remain Pill/Patch Appearance: Markings consistent with prescribed medication Bottle Appearance: Standard pharmacy container. Clearly labeled. Filled Date: 2 / 5 / 2025 Last Medication intake:  TodaySafety precautions to be maintained throughout the outpatient stay will include: orient to surroundings, keep bed in low position, maintain call bell within reach at all times, provide assistance with transfer out of bed and ambulation.    UDS:  Summary  Date Value Ref Range Status  09/21/2022 Note  Final    Comment:    ==================================================================== [facility]  (MW) ==================================================================== Test  Result       Flag       Units  Drug Present and Declared for Prescription Verification   Hydrocodone                    3262         EXPECTED   ng/mg creat   Hydromorphone                  150          EXPECTED   ng/mg creat   Dihydrocodeine                 171          EXPECTED   ng/mg creat   Norhydrocodone                 4195         EXPECTED   ng/mg creat    Sources of hydrocodone include scheduled prescription medications.    Hydromorphone, dihydrocodeine and norhydrocodone are expected    metabolites of hydrocodone. Hydromorphone and dihydrocodeine are    also available as scheduled prescription medications.  ==================================================================== Test                      Result    Flag   Units      Ref Range   Creatinine              101              mg/dL      >=04 ==================================================================== Declared Medications:  The flagging and interpretation on this report are based on the  following declared medications.  Unexpected results may arise from  inaccuracies in the declared medications.   **Note: The testing scope of this panel includes these medications:   Hydrocodone (Norco)   **Note: The testing scope of this panel does not include the  following reported medications:   Acetaminophen  Acetaminophen (Norco)  Aspirin  Atorvastatin  Chondroitin  Clobetasol (Temovate)  Cyanocobalamin  Gabapentin (Neurontin)  Glucosamine  Metoprolol  Omega-3 Fatty Acids  Pantoprazole  Topical Lidocaine  Ubiquinone (CoQ10)  Vitamin E ==================================================================== For clinical consultation, please call [phone]. ====================================================================      ROS  Constitutional: Denies any fever or chills Gastrointestinal: No  reported hemesis, hematochezia, vomiting, or acute GI distress Musculoskeletal:   low back pain Neurological: No reported episodes of acute onset apraxia, aphasia, dysarthria, agnosia, amnesia, paralysis, loss of coordination, or loss of consciousness  Medication Review  Aspirin-Acetaminophen, Coenzyme Q10, HYDROcodone-acetaminophen, Omega-3 Fatty Acids, Vitamin E Acetate, atorvastatin, clobetasol ointment, gabapentin, glucosamine-chondroitin, lidocaine, metoprolol succinate, pantoprazole, and vitamin B-12  History Review  Allergy: Caroline Campbell is allergic to panmycin [tetracycline], crestor [rosuvastatin calcium], and dexlansoprazole. Drug: Caroline Campbell  reports no history of drug use. Alcohol:  reports current alcohol use. Tobacco:  reports that she has quit smoking. She has never used smokeless tobacco. Social: Caroline Campbell  reports that she has quit smoking. She has never used smokeless tobacco. She reports current alcohol use. She reports that she does not use drugs. Medical:  has a past medical history of Arthritis, Cancer (HCC), GERD (gastroesophageal reflux disease), Hyperlipidemia, Hypertension, Lumbar disc disease, and PMR (polymyalgia rheumatica) (HCC). Surgical: Caroline Campbell  has a past surgical history that includes Abdominal hysterectomy; Colonoscopy with propofol (N/A, 09/06/2017); and Esophagogastroduodenoscopy (egd) with propofol (N/A, 09/06/2017). Family: family history is not on file.  Laboratory Chemistry Profile  Renal Lab Results  Component Value Date   BUN 12 04/15/2021   CREATININE 0.70 04/15/2021   BCR 17 04/15/2021    Hepatic Lab Results  Component Value Date   AST 20 04/15/2021   ALBUMIN 4.6 04/15/2021   ALKPHOS 72 04/15/2021    Electrolytes Lab Results  Component Value Date   NA 143 04/15/2021   K 4.6 04/15/2021   CL 106 04/15/2021   CALCIUM 9.8 04/15/2021   MG 2.1 04/15/2021    Bone Lab Results  Component Value Date   25OHVITD1 54 04/15/2021    25OHVITD2 <1.0 04/15/2021   25OHVITD3 54 04/15/2021    Inflammation (CRP: Acute Phase) (ESR: Chronic Phase) Lab Results  Component Value Date   CRP 1 04/15/2021   ESRSEDRATE 13 04/15/2021         Note: Above Lab results reviewed.  Recent Imaging Review  DG Lumbar Spine Complete W/Bend CLINICAL DATA:  Low back pain  EXAM: LUMBAR SPINE - COMPLETE WITH BENDING VIEWS  COMPARISON:  X-ray lumbar spine 05/01/2012  FINDINGS: Markedly limited evaluation due to overlapping osseous structures and overlying soft tissues.  Five non-rib-bearing lumbar vertebral bodies. Slight levocurvature of the lumbar spine centered at the L2-L3 level. Grade 1 anterolisthesis of L5 on S1. No change in alignment on flexion and extension views. Multilevel degenerative changes of the spine. There is no evidence of lumbar spine fracture. Alignment is normal. Intervertebral disc spaces are maintained. Aortic calcification.  IMPRESSION: 1. No acute displaced fracture or traumatic listhesis of the lumbar spine. 2. Grade 1 anterolisthesis of L5 on S1 with no change in alignment on extension and flexion views. 3.  Aortic Atherosclerosis (ICD10-I70.0).  Electronically Signed   By: Tish Frederickson M.D.   On: 04/15/2021 20:28 DG Thoracic Spine W/Swimmers CLINICAL DATA:  Back pain.  EXAM: THORACIC SPINE - 3 VIEWS  COMPARISON:  Chest radiograph dated 05/01/2012.  FINDINGS: No acute fracture or subluxation. The bones are osteopenic. Multilevel degenerative changes and scoliosis. Atherosclerotic calcification of the aorta. The soft tissues are unremarkable.  IMPRESSION: No acute/traumatic thoracic spine pathology.  Electronically Signed   By: Elgie Collard M.D.   On: 04/15/2021 20:22 DG Cervical Spine With Flex & Extend CLINICAL DATA:  Cervicalgia  EXAM: CERVICAL SPINE COMPLETE WITH FLEXION AND EXTENSION VIEWS  COMPARISON:  X-ray cervical spine 01/08/2013  FINDINGS: On the lateral view  the cervical spine is visualized to the level of C7. Normal cervical lordosis. On flexion view there is development of grade 1 anterolisthesis of C2 on C3, C3 on C4, C4 on C5.  Dens is well positioned between the lateral masses of C1. There is limited evaluation of the dens for acute fracture on the open-mouth view due to overlying osseous structures.  Diffusely decreased bone density. Mild-to-moderate degenerative change of the spine at the C5 through C7 levels. Osseous neural foraminal stenosis at the C5-C6 level on the left. No acute displaced fracture is detected.No aggressive-appearing focal osseous lesions.  Pre-vertebral soft tissues are within normal limits.  IMPRESSION: 1. On flexion view there is development of grade 1 anterolisthesis of C2 on C3, C3 on C4, C4 on C5. 2. Mild-to-moderate degenerative changes of the C5 through C7 levels with associated osseous neural foraminal stenosis at the C5-C6 level on the left. 3. No acute displaced fracture or traumatic listhesis of the cervical spine.  Electronically Signed   By: Tish Frederickson M.D.   On: 04/15/2021 18:46 DG HIP UNILAT W OR W/O PELVIS 2-3 VIEWS RIGHT  CLINICAL DATA:  Right hip pain  EXAM: DG HIP (WITH OR WITHOUT PELVIS) 2-3V RIGHT  COMPARISON:  None.  FINDINGS: No acute fracture or dislocation identified in the right hip. Moderate joint space narrowing with acetabular subchondral sclerosis and small marginal osteophyte formation. No acute fracture identified in the pelvis. Bones are osteopenic.  IMPRESSION: Chronic changes with no acute osseous abnormality identified.  Electronically Signed   By: Jannifer Hick M.D.   On: 04/15/2021 16:45 DG Shoulder Right CLINICAL DATA:  Right shoulder pain  EXAM: RIGHT SHOULDER - 2+ VIEW  COMPARISON:  None.  FINDINGS: No acute fracture or dislocation identified. Mild joint space narrowing. No suspicious bony lesions identified.  IMPRESSION: No acute  osseous abnormality identified.  Electronically Signed   By: Jannifer Hick M.D.   On: 04/15/2021 16:44 Note: Reviewed        Physical Exam  General appearance: Well nourished, well developed, and well hydrated. In no apparent acute distress Mental status: Alert, oriented x 3 (person, place, & time)       Respiratory: No evidence of acute respiratory distress Eyes: PERLA Vitals: BP (!) 170/60   Pulse 64   Temp 98.4 F (36.9 C)   Ht 5\' 2"  (1.575 m)   Wt 114 lb (51.7 kg)   SpO2 100%   BMI 20.85 kg/m  BMI: Estimated body mass index is 20.85 kg/m as calculated from the following:   Height as of this encounter: 5\' 2"  (1.575 m).   Weight as of this encounter: 114 lb (51.7 kg). Ideal: Ideal body weight: 50.1 kg (110 lb 7.2 oz) Adjusted ideal body weight: 50.7 kg (111 lb 13.9 oz)   Lumbar Spine Area Exam  Skin & Axial Inspection: No masses, redness, or swelling Alignment: Symmetrical Functional ROM: Pain restricted ROM affecting both sides Stability: No instability detected Muscle Tone/Strength: Functionally intact. No obvious neuro-muscular anomalies detected. Sensory (Neurological): Dermatomal pain pattern and MSK   Lower Extremity Exam      Side: Right lower extremity   Side: Left lower extremity  Stability: No instability observed           Stability: No instability observed          Skin & Extremity Inspection: Skin color, temperature, and hair growth are WNL. No peripheral edema or cyanosis. No masses, redness, swelling, asymmetry, or associated skin lesions. No contractures.   Skin & Extremity Inspection: Skin color, temperature, and hair growth are WNL. No peripheral edema or cyanosis. No masses, redness, swelling, asymmetry, or associated skin lesions. No contractures.  Functional ROM: Pain restricted ROM for hip joint           Functional ROM: Pain restricted ROM for hip joint          Muscle Tone/Strength: Functionally intact. No obvious neuro-muscular anomalies  detected.   Muscle Tone/Strength: Functionally intact. No obvious neuro-muscular anomalies detected.  Sensory (Neurological): Musculoskeletal pain pattern         Sensory (Neurological): Musculoskeletal pain pattern        DTR: Patellar: deferred today Achilles: deferred today Plantar: deferred today   DTR: Patellar: deferred today Achilles: deferred today Plantar: deferred today  Palpation: No palpable anomalies   Palpation: No palpable anomalies     Assessment   Status Diagnosis  Controlled Controlled Controlled 1. Chronic pain syndrome   2. 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 Osteoarthritis of spine with radiculopathy, lumbar region           Plan of  Care    Ms. Caroline Red Casso has a current medication list which includes the following long-term medication(s): atorvastatin, gabapentin, metoprolol succinate, and pantoprazole.  Pharmacotherapy (Medications Ordered): Meds ordered this encounter  Medications   HYDROcodone-acetaminophen (NORCO/VICODIN) 5-325 MG tablet    Sig: Take 1-2 tablets by mouth every 12 (twelve) hours as needed for severe pain (pain score 7-10). Must last 30 days.    Dispense:  90 tablet    Refill:  0    Chronic Pain: STOP Act (Not applicable) Fill 1 day early if closed on refill date. Avoid benzodiazepines within 8 hours of opioids   HYDROcodone-acetaminophen (NORCO/VICODIN) 5-325 MG tablet    Sig: Take 1-2 tablets by mouth every 12 (twelve) hours as needed for severe pain (pain score 7-10). Must last 30 days.    Dispense:  90 tablet    Refill:  0    Chronic Pain: STOP Act (Not applicable) Fill 1 day early if closed on refill date. Avoid benzodiazepines within 8 hours of opioids   HYDROcodone-acetaminophen (NORCO/VICODIN) 5-325 MG tablet    Sig: Take 1-2 tablets by mouth every 12 (twelve) hours as needed for severe pain (pain score 7-10). Must last 30 days.    Dispense:  90 tablet    Refill:  0    Chronic Pain: STOP Act (Not applicable)  Fill 1 day early if closed on refill date. Avoid benzodiazepines within 8 hours of opioids      Follow-up plan:   Return in about 3 months (around 09/14/2023) for MM, F2F.    Recent Visits Date Type Provider Dept  03/17/23 Office Visit Edward Jolly, MD [facility]  Showing recent visits within past 90 days and meeting all other requirements Today's Visits Date Type Provider Dept  06/14/23 Office Visit Edward Jolly, MD [facility]  Showing today's visits and meeting all other requirements Future Appointments Date Type Provider Dept  09/08/23 Appointment Edward Jolly, MD [facility]  Showing future appointments within next 90 days and meeting all other requirements  I discussed the assessment and treatment plan with the patient. The patient was provided an opportunity to ask questions and all were answered. The patient agreed with the plan and demonstrated an understanding of the instructions.  Patient advised to call back or seek an in-person evaluation if the symptoms or condition worsens.  Duration of encounter: .

## 2023-06-14 NOTE — Progress Notes (Signed)
 Nursing Pain Medication Assessment:  Safety precautions to be maintained throughout the outpatient stay will include: orient to surroundings, keep bed in low position, maintain call bell within reach at all times, provide assistance with transfer out of bed and ambulation.  Medication Inspection Compliance: Pill count conducted under aseptic conditions, in front of the patient. Neither the pills nor the bottle was removed from the patient's sight at any time. Once count was completed pills were immediately returned to the patient in their original bottle.  Medication: Hydrocodone/APAP Pill/Patch Count:  6 of 90 pills remain Pill/Patch Appearance: Markings consistent with prescribed medication Bottle Appearance: Standard pharmacy container. Clearly labeled. Filled Date: 2 / 5 / 2025 Last Medication intake:  TodaySafety precautions to be maintained throughout the outpatient stay will include: orient to surroundings, keep bed in low position, maintain call bell within reach at all times, provide assistance with transfer out of bed and ambulation.

## 2023-07-04 DIAGNOSIS — B029 Zoster without complications: Secondary | ICD-10-CM | POA: Insufficient documentation

## 2023-08-16 ENCOUNTER — Other Ambulatory Visit: Payer: Self-pay | Admitting: *Deleted

## 2023-08-16 ENCOUNTER — Telehealth: Payer: Self-pay

## 2023-08-16 DIAGNOSIS — M4726 Other spondylosis with radiculopathy, lumbar region: Secondary | ICD-10-CM

## 2023-08-16 DIAGNOSIS — G894 Chronic pain syndrome: Secondary | ICD-10-CM

## 2023-08-16 DIAGNOSIS — M48061 Spinal stenosis, lumbar region without neurogenic claudication: Secondary | ICD-10-CM

## 2023-08-16 NOTE — Telephone Encounter (Signed)
 Called [facility] to verify Rx for Hydrocodone  not there. Rx request sent to S. Lydia Sams, NP.

## 2023-08-16 NOTE — Telephone Encounter (Signed)
 [facility] doesn't have her prescription, and it was supposed to be called out. Can someone check on this?

## 2023-09-06 ENCOUNTER — Encounter: Payer: Self-pay | Admitting: Nurse Practitioner

## 2023-09-06 ENCOUNTER — Ambulatory Visit: Attending: Student in an Organized Health Care Education/Training Program | Admitting: Nurse Practitioner

## 2023-09-06 VITALS — BP 157/81 | HR 64 | Temp 97.8°F | Resp 16 | Ht 61.0 in | Wt 111.0 lb

## 2023-09-06 DIAGNOSIS — M48061 Spinal stenosis, lumbar region without neurogenic claudication: Secondary | ICD-10-CM | POA: Insufficient documentation

## 2023-09-06 DIAGNOSIS — Z79899 Other long term (current) drug therapy: Secondary | ICD-10-CM | POA: Diagnosis present

## 2023-09-06 DIAGNOSIS — G894 Chronic pain syndrome: Secondary | ICD-10-CM | POA: Insufficient documentation

## 2023-09-06 DIAGNOSIS — M47817 Spondylosis without myelopathy or radiculopathy, lumbosacral region: Secondary | ICD-10-CM | POA: Insufficient documentation

## 2023-09-06 DIAGNOSIS — M4726 Other spondylosis with radiculopathy, lumbar region: Secondary | ICD-10-CM | POA: Diagnosis present

## 2023-09-06 DIAGNOSIS — M545 Low back pain, unspecified: Secondary | ICD-10-CM | POA: Diagnosis present

## 2023-09-06 DIAGNOSIS — G8929 Other chronic pain: Secondary | ICD-10-CM | POA: Insufficient documentation

## 2023-09-06 MED ORDER — HYDROCODONE-ACETAMINOPHEN 5-325 MG PO TABS: 1.0000 | ORAL_TABLET | Freq: Three times a day (TID) | ORAL | 0 refills | Status: AC | PRN

## 2023-09-06 MED ORDER — HYDROCODONE-ACETAMINOPHEN 5-325 MG PO TABS: 1.0000 | ORAL_TABLET | Freq: Three times a day (TID) | ORAL | 0 refills | Status: DC | PRN

## 2023-09-06 NOTE — Progress Notes (Signed)
 Nursing Pain Medication Assessment:  Safety precautions to be maintained throughout the outpatient stay will include: orient to surroundings, keep bed in low position, maintain call bell within reach at all times, provide assistance with transfer out of bed and ambulation.  Medication Inspection Compliance: Pill count conducted under aseptic conditions, in front of the patient. Neither the pills nor the bottle was removed from the patient's sight at any time. Once count was completed pills were immediately returned to the patient in their original bottle.  Medication: Hydrocodone /APAP Pill/Patch Count: 16 of 90 pills/patches remain Pill/Patch Appearance: Markings consistent with prescribed medication Bottle Appearance: Standard pharmacy container. Clearly labeled. Filled Date: 5 / 6 / 2025 Last Medication intake:  Today

## 2023-09-06 NOTE — Progress Notes (Signed)
 PROVIDER NOTE: Interpretation of information contained herein should be left to medically-trained personnel. Specific patient instructions are provided elsewhere under "Patient Instructions" section of medical record. This document was created in part using AI and STT-dictation technology, any transcriptional errors that may result from this process are unintentional.  Patient: Caroline Campbell  Service: E/M   PCP: Jimmy Moulding, MD  DOB: [birth year]  DOS: 09/06/2023  Provider: Cherylin Corrigan, NP  MRN: [id number]  Delivery: Face-to-face  Specialty: Interventional Pain Management  Type: Established Patient  Setting: Ambulatory outpatient facility  Specialty designation: 09  Referring Prov.: Jimmy Moulding, MD  Location: Outpatient office facility       History of present illness (HPI) Ms. Isidore Mares, a 88 y.o. year old female, is here today because of her Chronic pain syndrome [G89.4]. Ms. Haseman primary complain today is Back Pain (lower), Hip Pain (right), and Shoulder Pain (right)   Pain Assessment: Severity of Chronic pain is reported as a 5 /10. Location: Back Lower/"sometimes, rarely, to left hip". Onset: More than a month ago. Quality: Constant, Sharp. Timing: Constant. Modifying factor(s): meds don't help as much as it used too. Vitals:  height is 5\' 1"  (1.549 m) and weight is 111 lb (50.3 kg). Her temperature is 97.8 F (36.6 C). Her blood pressure is 157/81 (abnormal) and her pulse is 64. Her respiration is 16 and oxygen saturation is 100%.  BMI: Estimated body mass index is 20.97 kg/m as calculated from the following:   Height as of this encounter: 5\' 1"  (1.549 m).   Weight as of this encounter: 111 lb (50.3 kg).  Last encounter: 06/14/2023 Last procedure:   Reason for encounter: medication management. No change in medical history since last visit.  Patient's pain is at baseline.  Patient continues multimodal pain regimen as prescribed.  States that it provides pain  relief and improvement in functional status.   Ms. Amster had singles approximately 2 months ago, affecting the right side of the neck and upper chest.  She is currently recovering from the episode.  She reports lower back pain, right hip pain and right shoulder pain.   Pharmacotherapy Assessment  Analgesic: Hydrocodone -acetaminophen  (Norco/Vicodin) 5-325 mg tablet every 8 hours as needed for severe pain. MME=20.45  Monitoring: [place] PMP: PDMP reviewed during this encounter.       Pharmacotherapy: No side-effects or adverse reactions reported. Compliance: No problems identified. Effectiveness: Clinically acceptable.  Lennis Rabon, RN  09/06/2023  9:10 AM  Sign when Signing Visit Nursing Pain Medication Assessment:  Safety precautions to be maintained throughout the outpatient stay will include: orient to surroundings, keep bed in low position, maintain call bell within reach at all times, provide assistance with transfer out of bed and ambulation.  Medication Inspection Compliance: Pill count conducted under aseptic conditions, in front of the patient. Neither the pills nor the bottle was removed from the patient's sight at any time. Once count was completed pills were immediately returned to the patient in their original bottle.  Medication: Hydrocodone /APAP Pill/Patch Count: 16 of 90 pills/patches remain Pill/Patch Appearance: Markings consistent with prescribed medication Bottle Appearance: Standard pharmacy container. Clearly labeled. Filled Date: 5 / 6 / 2025 Last Medication intake:  Today    No results found for: "CBDTHCR" No results found for: "D8THCCBX" No results found for: "D9THCCBX"  UDS:  Summary  Date Value Ref Range Status  09/21/2022 Note  Final    Comment:    ==================================================================== [employer] Select 13 (MW) ==================================================================== Test  Result        Flag       Units  Drug Present and Declared for Prescription Verification   Hydrocodone                     3262         EXPECTED   ng/mg creat   Hydromorphone                  150          EXPECTED   ng/mg creat   Dihydrocodeine                 171          EXPECTED   ng/mg creat   Norhydrocodone                 4195         EXPECTED   ng/mg creat    Sources of hydrocodone  include scheduled prescription medications.    Hydromorphone, dihydrocodeine and norhydrocodone are expected    metabolites of hydrocodone . Hydromorphone and dihydrocodeine are    also available as scheduled prescription medications.  ==================================================================== Test                      Result    Flag   Units      Ref Range   Creatinine              101              mg/dL      >=16 ==================================================================== Declared Medications:  The flagging and interpretation on this report are based on the  following declared medications.  Unexpected results may arise from  inaccuracies in the declared medications.   **Note: The testing scope of this panel includes these medications:   Hydrocodone  (Norco)   **Note: The testing scope of this panel does not include the  following reported medications:   Acetaminophen   Acetaminophen  (Norco)  Aspirin  Atorvastatin  Chondroitin  Clobetasol  (Temovate )  Cyanocobalamin  Gabapentin (Neurontin)  Glucosamine  Metoprolol  Omega-3 Fatty Acids  Pantoprazole  Topical Lidocaine   Ubiquinone (CoQ10)  Vitamin E ==================================================================== For clinical consultation, please call [phone]. ====================================================================      ROS  Constitutional: Denies any fever or chills Gastrointestinal: No reported hemesis, hematochezia, vomiting, or acute GI distress Musculoskeletal: Bilateral low back pain, right hip pain,  right shoulder pain Neurological: No reported episodes of acute onset apraxia, aphasia, dysarthria, agnosia, amnesia, paralysis, loss of coordination, or loss of consciousness  Medication Review  Aspirin-Acetaminophen , Coenzyme Q10, HYDROcodone -acetaminophen , Omega-3 Fatty Acids, Vitamin E Acetate, atorvastatin, clobetasol  ointment, gabapentin, glucosamine-chondroitin, lidocaine , metoprolol succinate, pantoprazole, and vitamin B-12  History Review  Allergy: Ms. Selk is allergic to panmycin [tetracycline], crestor [rosuvastatin calcium], and dexlansoprazole. Drug: Ms. Cham  reports no history of drug use. Alcohol:  reports current alcohol use. Tobacco:  reports that she has quit smoking. She has never used smokeless tobacco. Social: Ms. Granados  reports that she has quit smoking. She has never used smokeless tobacco. She reports current alcohol use. She reports that she does not use drugs. Medical:  has a past medical history of Arthritis, Cancer (HCC), GERD (gastroesophageal reflux disease), Hyperlipidemia, Hypertension, Lumbar disc disease, and PMR (polymyalgia rheumatica) (HCC). Surgical: Ms. Sardinha  has a past surgical history that includes Abdominal hysterectomy; Colonoscopy with propofol  (N/A, 09/06/2017); and Esophagogastroduodenoscopy (egd) with propofol  (N/A, 09/06/2017). Family: family history is not  on file.  Laboratory Chemistry Profile   Renal Lab Results  Component Value Date   BUN 12 04/15/2021   CREATININE 0.70 04/15/2021   BCR 17 04/15/2021    Hepatic Lab Results  Component Value Date   AST 20 04/15/2021   ALBUMIN 4.6 04/15/2021   ALKPHOS 72 04/15/2021    Electrolytes Lab Results  Component Value Date   NA 143 04/15/2021   K 4.6 04/15/2021   CL 106 04/15/2021   CALCIUM 9.8 04/15/2021   MG 2.1 04/15/2021    Bone Lab Results  Component Value Date   25OHVITD1 54 04/15/2021   25OHVITD2 <1.0 04/15/2021   25OHVITD3 54 04/15/2021    Inflammation (CRP: Acute  Phase) (ESR: Chronic Phase) Lab Results  Component Value Date   CRP 1 04/15/2021   ESRSEDRATE 13 04/15/2021         Note: Above Lab results reviewed.  Recent Imaging Review  DG Lumbar Spine Complete W/Bend CLINICAL DATA:  Low back pain  EXAM: LUMBAR SPINE - COMPLETE WITH BENDING VIEWS  COMPARISON:  X-ray lumbar spine 05/01/2012  FINDINGS: Markedly limited evaluation due to overlapping osseous structures and overlying soft tissues.  Five non-rib-bearing lumbar vertebral bodies. Slight levocurvature of the lumbar spine centered at the L2-L3 level. Grade 1 anterolisthesis of L5 on S1. No change in alignment on flexion and extension views. Multilevel degenerative changes of the spine. There is no evidence of lumbar spine fracture. Alignment is normal. Intervertebral disc spaces are maintained. Aortic calcification.  IMPRESSION: 1. No acute displaced fracture or traumatic listhesis of the lumbar spine. 2. Grade 1 anterolisthesis of L5 on S1 with no change in alignment on extension and flexion views. 3.  Aortic Atherosclerosis (ICD10-I70.0).  Electronically Signed   By: Morgane  Naveau M.D.   On: 04/15/2021 20:28 DG Thoracic Spine W/Swimmers CLINICAL DATA:  Back pain.  EXAM: THORACIC SPINE - 3 VIEWS  COMPARISON:  Chest radiograph dated 05/01/2012.  FINDINGS: No acute fracture or subluxation. The bones are osteopenic. Multilevel degenerative changes and scoliosis. Atherosclerotic calcification of the aorta. The soft tissues are unremarkable.  IMPRESSION: No acute/traumatic thoracic spine pathology.  Electronically Signed   By: Angus Bark M.D.   On: 04/15/2021 20:22 DG Cervical Spine With Flex & Extend CLINICAL DATA:  Cervicalgia  EXAM: CERVICAL SPINE COMPLETE WITH FLEXION AND EXTENSION VIEWS  COMPARISON:  X-ray cervical spine 01/08/2013  FINDINGS: On the lateral view the cervical spine is visualized to the level of C7. Normal cervical lordosis. On  flexion view there is development of grade 1 anterolisthesis of C2 on C3, C3 on C4, C4 on C5.  Dens is well positioned between the lateral masses of C1. There is limited evaluation of the dens for acute fracture on the open-mouth view due to overlying osseous structures.  Diffusely decreased bone density. Mild-to-moderate degenerative change of the spine at the C5 through C7 levels. Osseous neural foraminal stenosis at the C5-C6 level on the left. No acute displaced fracture is detected.No aggressive-appearing focal osseous lesions.  Pre-vertebral soft tissues are within normal limits.  IMPRESSION: 1. On flexion view there is development of grade 1 anterolisthesis of C2 on C3, C3 on C4, C4 on C5. 2. Mild-to-moderate degenerative changes of the C5 through C7 levels with associated osseous neural foraminal stenosis at the C5-C6 level on the left. 3. No acute displaced fracture or traumatic listhesis of the cervical spine.  Electronically Signed   By: Morgane  Naveau M.D.   On: 04/15/2021 18:46 DG HIP  UNILAT W OR W/O PELVIS 2-3 VIEWS RIGHT CLINICAL DATA:  Right hip pain  EXAM: DG HIP (WITH OR WITHOUT PELVIS) 2-3V RIGHT  COMPARISON:  None.  FINDINGS: No acute fracture or dislocation identified in the right hip. Moderate joint space narrowing with acetabular subchondral sclerosis and small marginal osteophyte formation. No acute fracture identified in the pelvis. Bones are osteopenic.  IMPRESSION: Chronic changes with no acute osseous abnormality identified.  Electronically Signed   By: Armond Lands M.D.   On: 04/15/2021 16:45 DG Shoulder Right CLINICAL DATA:  Right shoulder pain  EXAM: RIGHT SHOULDER - 2+ VIEW  COMPARISON:  None.  FINDINGS: No acute fracture or dislocation identified. Mild joint space narrowing. No suspicious bony lesions identified.  IMPRESSION: No acute osseous abnormality identified.  Electronically Signed   By: Armond Lands  M.D.   On: 04/15/2021 16:44 Note: Reviewed         Physical Exam  General appearance: Well nourished, well developed, and well hydrated. In no apparent acute distress Mental status: Alert, oriented x 3 (person, place, & time)       Respiratory: No evidence of acute respiratory distress Eyes: PERLA Vitals: BP (!) 157/81   Pulse 64   Temp 97.8 F (36.6 C)   Resp 16   Ht 5\' 1"  (1.549 m)   Wt 111 lb (50.3 kg)   SpO2 100%   BMI 20.97 kg/m  BMI: Estimated body mass index is 20.97 kg/m as calculated from the following:   Height as of this encounter: 5\' 1"  (1.549 m).   Weight as of this encounter: 111 lb (50.3 kg). Ideal: Ideal body weight: 47.8 kg (105 lb 6.1 oz) Adjusted ideal body weight: 48.8 kg (107 lb 10 oz)  Assessment   Diagnosis Status  1. Chronic pain syndrome   2. 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 Osteoarthritis of spine with radiculopathy, lumbar region   4. Lumbosacral facet arthropathy   5. Chronic low back pain (1ry area of Pain) (Bilateral) (R>L) w/o sciatica   6. 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 Medication management    Controlled Controlled Controlled   Updated Problems: No problems updated.  Plan of Care  Problem-specific:  Assessment and Plan We will continue on current medication regimen.  Prescribing drug monitoring (PDMP) reviewed; findings consistent with use of prescribed medication and no evidence of narcotic misuse or abuse.  Schedule follow-up visit in 90 days.  No other new issues or problems reported to this visit. Routine UDS ordered today.   Ms. HOLLYE PRITT has a current medication list which includes the following long-term medication(s): atorvastatin, gabapentin, metoprolol succinate, and pantoprazole.  Pharmacotherapy (Medications Ordered): Meds ordered this encounter  Medications   HYDROcodone -acetaminophen  (NORCO/VICODIN) 5-325 MG tablet    Sig: Take 1 tablet by mouth every 8 (eight) hours as  needed for severe pain (pain score 7-10). Must last 30 days.    Dispense:  90 tablet    Refill:  0    Chronic Pain: STOP Act (Not applicable) Fill 1 day early if closed on refill date. Avoid benzodiazepines within 8 hours of opioids   HYDROcodone -acetaminophen  (NORCO/VICODIN) 5-325 MG tablet    Sig: Take 1 tablet by mouth every 8 (eight) hours as needed for severe pain (pain score 7-10). Must last 30 days.    Dispense:  90 tablet    Refill:  0    Chronic Pain: STOP Act (Not applicable) Fill 1 day early if closed on  refill date. Avoid benzodiazepines within 8 hours of opioids   HYDROcodone -acetaminophen  (NORCO/VICODIN) 5-325 MG tablet    Sig: Take 1 tablet by mouth every 8 (eight) hours as needed for severe pain (pain score 7-10). Must last 30 days.    Dispense:  90 tablet    Refill:  0    Chronic Pain: STOP Act (Not applicable) Fill 1 day early if closed on refill date. Avoid benzodiazepines within 8 hours of opioids   Orders:  Orders Placed This Encounter  Procedures   Tox[employer]SU[employer] Select 13 (MW), Urine    Volume: 30 ml(s). Minimum 3 ml of urine is needed. Document temperature of fresh sample. Indications: Long term (current) use of opiate analgesic (G95.621)    Release to patient:   Immediate        Return in about 3 months (around 12/07/2023) for (F2F), (MM), Marthe Slain NP.    Recent Visits Date Type Provider Dept  06/14/23 Office Visit Cephus Collin, MD [facility]  Showing recent visits within past 90 days and meeting all other requirements Today's Visits Date Type Provider Dept  09/06/23 Office Visit Parrish Bonn K, NP [facility]  Showing today's visits and meeting all other requirements Future Appointments No visits were found meeting these conditions. Showing future appointments within next 90 days and meeting all other requirements  I discussed the assessment and treatment plan with the patient. The patient was provided an opportunity to ask  questions and all were answered. The patient agreed with the plan and demonstrated an understanding of the instructions.  Patient advised to call back or seek an in-person evaluation if the symptoms or condition worsens.  Duration of encounter: 30 minutes.  Total time on encounter, as per AMA guidelines included both the face-to-face and non-face-to-face time personally spent by the physician and/or other qualified health care professional(s) on the day of the encounter (includes time in activities that require the physician or other qualified health care professional and does not include time in activities normally performed by clinical staff). Physician's time may include the following activities when performed: Preparing to see the patient (e.g., pre-charting review of records, searching for previously ordered imaging, lab work, and nerve conduction tests) Review of prior analgesic pharmacotherapies. Reviewing PMP Interpreting ordered tests (e.g., lab work, imaging, nerve conduction tests) Performing post-procedure evaluations, including interpretation of diagnostic procedures Obtaining and/or reviewing separately obtained history Performing a medically appropriate examination and/or evaluation Counseling and educating the patient/family/caregiver Ordering medications, tests, or procedures Referring and communicating with other health care professionals (when not separately reported) Documenting clinical information in the electronic or other health record Independently interpreting results (not separately reported) and communicating results to the patient/ family/caregiver Care coordination (not separately reported)  Note by: Ilaisaane Marts K Maleeah Crossman, NP (TTS and AI technology used. I apologize for any typographical errors that were not detected and corrected.) Date: 09/06/2023; Time: 10:49 AM

## 2023-09-07 ENCOUNTER — Telehealth: Payer: Self-pay | Admitting: Student in an Organized Health Care Education/Training Program

## 2023-09-07 NOTE — Telephone Encounter (Signed)
 Patient called in tears about her appt yesterday. She wants to see only Dr Caroline Campbell. Says her meds were changed how she takes them and that doesn't work for her. She needs to take 2 in the morning and 1 at bedtime. Wants to change her med mgmt appt to Dr Caroline Campbell only

## 2023-09-08 ENCOUNTER — Encounter: Admitting: Student in an Organized Health Care Education/Training Program

## 2023-09-08 LAB — TOXASSURE SELECT 13 (MW), URINE

## 2023-12-07 ENCOUNTER — Encounter: Payer: Self-pay | Admitting: Nurse Practitioner

## 2023-12-07 ENCOUNTER — Ambulatory Visit: Attending: Nurse Practitioner | Admitting: Student in an Organized Health Care Education/Training Program

## 2023-12-07 VITALS — BP 160/78 | HR 85 | Temp 98.0°F | Resp 16 | Ht 62.0 in | Wt 112.0 lb

## 2023-12-07 DIAGNOSIS — M48061 Spinal stenosis, lumbar region without neurogenic claudication: Secondary | ICD-10-CM | POA: Diagnosis not present

## 2023-12-07 DIAGNOSIS — M4726 Other spondylosis with radiculopathy, lumbar region: Secondary | ICD-10-CM | POA: Insufficient documentation

## 2023-12-07 DIAGNOSIS — G894 Chronic pain syndrome: Secondary | ICD-10-CM | POA: Diagnosis present

## 2023-12-07 MED ORDER — HYDROCODONE-ACETAMINOPHEN 5-325 MG PO TABS: 1.0000 | ORAL_TABLET | Freq: Four times a day (QID) | ORAL | 0 refills | Status: DC | PRN

## 2023-12-07 MED ORDER — HYDROCODONE-ACETAMINOPHEN 5-325 MG PO TABS: 1.0000 | ORAL_TABLET | Freq: Four times a day (QID) | ORAL | 0 refills | Status: AC | PRN

## 2023-12-07 NOTE — Progress Notes (Signed)
 PROVIDER NOTE: Information contained herein reflects review and annotations entered in association with encounter. Interpretation of such information and data should be left to medically-trained personnel. Information provided to patient can be located elsewhere in the medical record under Patient Instructions. Document created using STT-dictation technology, any transcriptional errors that may result from process are unintentional.    Patient: Caroline Campbell  Service Category: E/M  Provider: Wallie Sherry, MD  DOB: [birth year]  DOS: 12/07/2023  Specialty: Interventional Pain Management  MRN: [id number]  Setting: Ambulatory outpatient  PCP: Lenon Layman ORN, MD  Type: Established Patient    Referring Provider: Lenon Layman ORN, MD  Location: Office  Delivery: Face-to-face     HPI  [name], a 88 y.o. year old female, is here today because of her Chronic pain syndrome [G89.4]. [name] primary complain today is Back Pain Last encounter: My last encounter with her was on 09/06/23 Pertinent problems: 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 Pain Assessment: Severity of Chronic pain is reported as a 5 /10. Location: Back Lower/to hips bilat, left is worse. Onset: More than a month ago. Quality: Caroline Campbell, Aching. Timing: Constant. Modifying factor(s): meds, goodys. Vitals:  height is 5' 2 (1.575 m) and weight is 112 lb (50.8 kg). Her temperature is 98 F (36.7 C). Her blood pressure is 160/78 (abnormal) and her pulse  is 85. Her respiration is 16 and oxygen saturation is 100%.   Reason for encounter: medication management.   Patient presents today for medication management.  She is very tearful today and states that she has been having trouble managing her pain on her current regimen.  She states that she has been taking 2 hydrocodone 's equaling 10 mg in the morning and 1-2 hydrocodone 's in the evening equaling 5 to 10 mg.  She is prescribed a quantity of 90 currently.  No side effects.  She states that the medications help manage her pain and improve her function.  She is stable to work outside and also completes chores and activities more comfortably.  I informed her that we will adjust her medication regiment to it reflect her dosing regimen of 10 mg twice daily as needed.  She was pleased with this plan.   Pharmacotherapy Assessment  Analgesic: Hydrocodone  10 mg mg every 12 hrs PRN   Monitoring: [place] PMP: PDMP reviewed during this encounter.       Pharmacotherapy: No side-effects or adverse reactions reported. Compliance: No problems identified. Effectiveness: Clinically acceptable.  Dayna Pulling, RN  12/07/2023  9:54 AM  Sign when Signing Visit Nursing Pain Medication Assessment:  Safety precautions to be maintained throughout the outpatient stay will include: orient to surroundings, keep bed in low position, maintain call bell within reach at all times, provide assistance with transfer out of bed and ambulation.  Medication Inspection Compliance: Pill count conducted under aseptic conditions, in front of the patient. Neither the pills nor the bottle was removed from the patient's sight at any time. Once count was completed pills were immediately returned to the patient in their original bottle.  Medication: Hydrocodone /APAP Pill/Patch Count: 9 of 90 pills/patches remain Pill/Patch Appearance: Markings consistent  with prescribed medication Bottle Appearance: Standard pharmacy container. Clearly  labeled. Filled Date: 8 / 4 / 2025 Last Medication intake:  Today     UDS:  Summary  Date Value Ref Range Status  09/06/2023 FINAL  Final    Comment:    ==================================================================== [employer] [facility] (MW) ==================================================================== Test                             Result       Flag       Units  Drug Present and Declared for Prescription Verification   Hydrocodone                     4830         EXPECTED   ng/mg creat   Hydromorphone                  545          EXPECTED   ng/mg creat   Dihydrocodeine                 288          EXPECTED   ng/mg creat   Norhydrocodone                 4952         EXPECTED   ng/mg creat    Sources of hydrocodone  include scheduled prescription medications.    Hydromorphone, dihydrocodeine and norhydrocodone are expected    metabolites of hydrocodone . Hydromorphone and dihydrocodeine are    also available as scheduled prescription medications.  ==================================================================== Test                      Result    Flag   Units      Ref Range   Creatinine              69               mg/dL      >=79 ==================================================================== Declared Medications:  The flagging and interpretation on this report are based on the  following declared medications.  Unexpected results may arise from  inaccuracies in the declared medications.   **Note: The testing scope of this panel includes these medications:   Hydrocodone  (Norco)   **Note: The testing scope of this panel does not include the  following reported medications:   Acetaminophen   Acetaminophen  (Norco)  Aspirin  Atorvastatin (Lipitor)  Chondroitin  Cyanocobalamin  Gabapentin (Neurontin)  Glucosamine  Lidocaine   Metoprolol (Toprol)  Omega-3 Fatty Acids  Pantoprazole (Protonix)  Topical  Ubiquinone (CoQ10)  Vitamin  E ==================================================================== For clinical consultation, please call [phone]. ====================================================================      ROS  Constitutional: Denies any fever or chills Gastrointestinal: No reported hemesis, hematochezia, vomiting, or acute GI distress Musculoskeletal:  low back pain Neurological: No reported episodes of acute onset apraxia, aphasia, dysarthria, agnosia, amnesia, paralysis, loss of coordination, or loss of consciousness  Medication Review  Aspirin-Acetaminophen , Coenzyme Q10, HYDROcodone -acetaminophen , Omega-3 Fatty Acids, Vitamin E Acetate, atorvastatin, clobetasol  ointment, gabapentin, glucosamine-chondroitin, lidocaine , metoprolol succinate, pantoprazole, and vitamin B-12  History Review  Allergy: Ms. Morua is allergic to panmycin [tetracycline], crestor [rosuvastatin calcium], and dexlansoprazole. Drug: Ms. Bolda  reports no history of drug use. Alcohol:  reports current alcohol use. Tobacco:  reports that she has quit smoking. She has never used smokeless tobacco. Social: Ms. Kealey  reports that  she has quit smoking. She has never used smokeless tobacco. She reports current alcohol use. She reports that she does not use drugs. Medical:  has a past medical history of Arthritis, Cancer (HCC), GERD (gastroesophageal reflux disease), Hyperlipidemia, Hypertension, Lumbar disc disease, and PMR (polymyalgia rheumatica) (HCC). Surgical: Ms. Demarinis  has a past surgical history that includes Abdominal hysterectomy; Colonoscopy with propofol  (N/A, 09/06/2017); and Esophagogastroduodenoscopy (egd) with propofol  (N/A, 09/06/2017). Family: family history is not on file.  Laboratory Chemistry Profile   Renal Lab Results  Component Value Date   BUN 12 04/15/2021   CREATININE 0.70 04/15/2021   BCR 17 04/15/2021    Hepatic Lab Results  Component Value Date   AST 20 04/15/2021   ALBUMIN 4.6  04/15/2021   ALKPHOS 72 04/15/2021    Electrolytes Lab Results  Component Value Date   NA 143 04/15/2021   K 4.6 04/15/2021   CL 106 04/15/2021   CALCIUM 9.8 04/15/2021   MG 2.1 04/15/2021    Bone Lab Results  Component Value Date   25OHVITD1 54 04/15/2021   25OHVITD2 <1.0 04/15/2021   25OHVITD3 54 04/15/2021    Inflammation (CRP: Acute Phase) (ESR: Chronic Phase) Lab Results  Component Value Date   CRP 1 04/15/2021   ESRSEDRATE 13 04/15/2021         Note: Above Lab results reviewed.  Recent Imaging Review  DG Lumbar Spine Complete W/Bend CLINICAL DATA:  Low back pain  EXAM: LUMBAR SPINE - COMPLETE WITH BENDING VIEWS  COMPARISON:  X-ray lumbar spine 05/01/2012  FINDINGS: Markedly limited evaluation due to overlapping osseous structures and overlying soft tissues.  Five non-rib-bearing lumbar vertebral bodies. Slight levocurvature of the lumbar spine centered at the L2-L3 level. Grade 1 anterolisthesis of L5 on S1. No change in alignment on flexion and extension views. Multilevel degenerative changes of the spine. There is no evidence of lumbar spine fracture. Alignment is normal. Intervertebral disc spaces are maintained. Aortic calcification.  IMPRESSION: 1. No acute displaced fracture or traumatic listhesis of the lumbar spine. 2. Grade 1 anterolisthesis of L5 on S1 with no change in alignment on extension and flexion views. 3.  Aortic Atherosclerosis (ICD10-I70.0).  Electronically Signed   By: Morgane  Naveau M.D.   On: 04/15/2021 20:28 DG Thoracic Spine W/Swimmers CLINICAL DATA:  Back pain.  EXAM: THORACIC SPINE - 3 VIEWS  COMPARISON:  Chest radiograph dated 05/01/2012.  FINDINGS: No acute fracture or subluxation. The bones are osteopenic. Multilevel degenerative changes and scoliosis. Atherosclerotic calcification of the aorta. The soft tissues are unremarkable.  IMPRESSION: No acute/traumatic thoracic spine pathology.  Electronically  Signed   By: Vanetta Chou M.D.   On: 04/15/2021 20:22 DG Cervical Spine With Flex & Extend CLINICAL DATA:  Cervicalgia  EXAM: CERVICAL SPINE COMPLETE WITH FLEXION AND EXTENSION VIEWS  COMPARISON:  X-ray cervical spine 01/08/2013  FINDINGS: On the lateral view the cervical spine is visualized to the level of C7. Normal cervical lordosis. On flexion view there is development of grade 1 anterolisthesis of C2 on C3, C3 on C4, C4 on C5.  Dens is well positioned between the lateral masses of C1. There is limited evaluation of the dens for acute fracture on the open-mouth view due to overlying osseous structures.  Diffusely decreased bone density. Mild-to-moderate degenerative change of the spine at the C5 through C7 levels. Osseous neural foraminal stenosis at the C5-C6 level on the left. No acute displaced fracture is detected.No aggressive-appearing focal osseous lesions.  Pre-vertebral soft tissues are  within normal limits.  IMPRESSION: 1. On flexion view there is development of grade 1 anterolisthesis of C2 on C3, C3 on C4, C4 on C5. 2. Mild-to-moderate degenerative changes of the C5 through C7 levels with associated osseous neural foraminal stenosis at the C5-C6 level on the left. 3. No acute displaced fracture or traumatic listhesis of the cervical spine.  Electronically Signed   By: Morgane  Naveau M.D.   On: 04/15/2021 18:46 DG HIP UNILAT W OR W/O PELVIS 2-3 VIEWS RIGHT CLINICAL DATA:  Right hip pain  EXAM: DG HIP (WITH OR WITHOUT PELVIS) 2-3V RIGHT  COMPARISON:  None.  FINDINGS: No acute fracture or dislocation identified in the right hip. Moderate joint space narrowing with acetabular subchondral sclerosis and small marginal osteophyte formation. No acute fracture identified in the pelvis. Bones are osteopenic.  IMPRESSION: Chronic changes with no acute osseous abnormality identified.  Electronically Signed   By: Catarina Pouch M.D.   On:  04/15/2021 16:45 DG Shoulder Right CLINICAL DATA:  Right shoulder pain  EXAM: RIGHT SHOULDER - 2+ VIEW  COMPARISON:  None.  FINDINGS: No acute fracture or dislocation identified. Mild joint space narrowing. No suspicious bony lesions identified.  IMPRESSION: No acute osseous abnormality identified.  Electronically Signed   By: Catarina Pouch M.D.   On: 04/15/2021 16:44 Note: Reviewed        Physical Exam  General appearance: Well nourished, well developed, and well hydrated. In no apparent acute distress Mental status: Alert, oriented x 3 (person, place, & time)       Respiratory: No evidence of acute respiratory distress Eyes: PERLA Vitals: BP (!) 160/78   Pulse 85   Temp 98 F (36.7 C)   Resp 16   Ht 5' 2 (1.575 m)   Wt 112 lb (50.8 kg)   SpO2 100%   BMI 20.49 kg/m  BMI: Estimated body mass index is 20.49 kg/m as calculated from the following:   Height as of this encounter: 5' 2 (1.575 m).   Weight as of this encounter: 112 lb (50.8 kg). Ideal: Ideal body weight: 50.1 kg (110 lb 7.2 oz) Adjusted ideal body weight: 50.4 kg (111 lb 1.1 oz)   Lumbar Spine Area Exam  Skin & Axial Inspection: No masses, redness, or swelling Alignment: Symmetrical Functional ROM: Pain restricted ROM affecting both sides Stability: No instability detected Muscle Tone/Strength: Functionally intact. No obvious neuro-muscular anomalies detected. Sensory (Neurological): Dermatomal pain pattern and MSK   Lower Extremity Exam      Side: Right lower extremity   Side: Left lower extremity  Stability: No instability observed           Stability: No instability observed          Skin & Extremity Inspection: Skin color, temperature, and hair growth are WNL. No peripheral edema or cyanosis. No masses, redness, swelling, asymmetry, or associated skin lesions. No contractures.   Skin & Extremity Inspection: Skin color, temperature, and hair growth are WNL. No peripheral edema or cyanosis. No  masses, redness, swelling, asymmetry, or associated skin lesions. No contractures.  Functional ROM: Pain restricted ROM for hip joint           Functional ROM: Pain restricted ROM for hip joint          Muscle Tone/Strength: Functionally intact. No obvious neuro-muscular anomalies detected.   Muscle Tone/Strength: Functionally intact. No obvious neuro-muscular anomalies detected.  Sensory (Neurological): Musculoskeletal pain pattern         Sensory (Neurological):  Musculoskeletal pain pattern        DTR: Patellar: deferred today Achilles: deferred today Plantar: deferred today   DTR: Patellar: deferred today Achilles: deferred today Plantar: deferred today  Palpation: No palpable anomalies   Palpation: No palpable anomalies     Assessment   Status Diagnosis  Controlled Controlled Controlled 1. Chronic pain syndrome   2. 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 Osteoarthritis of spine with radiculopathy, lumbar region           Plan of Care    [name] has a current medication list which includes the following long-term medication(s): atorvastatin, gabapentin, metoprolol succinate, and pantoprazole.  Pharmacotherapy (Medications Ordered): Meds ordered this encounter  Medications   HYDROcodone -acetaminophen  (NORCO/VICODIN) 5-325 MG tablet    Sig: Take 1 tablet by mouth every 6 (six) hours as needed for severe pain (pain score 7-10). Must last 30 days.    Dispense:  120 tablet    Refill:  0    Chronic Pain: STOP Act (Not applicable) Fill 1 day early if closed on refill date. Avoid benzodiazepines within 8 hours of opioids   HYDROcodone -acetaminophen  (NORCO/VICODIN) 5-325 MG tablet    Sig: Take 1 tablet by mouth every 6 (six) hours as needed for severe pain (pain score 7-10). Must last 30 days.    Dispense:  120 tablet    Refill:  0    Chronic Pain: STOP Act (Not applicable) Fill 1 day early if closed on refill date. Avoid benzodiazepines within 8 hours of  opioids      Follow-up plan:   Return in about 8 weeks (around 02/01/2024) for MM, F2F, Emmy Blanch, MM.    Recent Visits No visits were found meeting these conditions. Showing recent visits within past 90 days and meeting all other requirements Today's Visits Date Type Provider Dept  12/07/23 Office Visit Marcelino Nurse, MD [facility]  Showing today's visits and meeting all other requirements Future Appointments Date Type Provider Dept  01/25/24 Appointment Patel, Seema K, NP [facility]  Showing future appointments within next 90 days and meeting all other requirements  I discussed the assessment and treatment plan with the patient. The patient was provided an opportunity to ask questions and all were answered. The patient agreed with the plan and demonstrated an understanding of the instructions.  Patient advised to call back or seek an in-person evaluation if the symptoms or condition worsens.  Duration of encounter: .

## 2023-12-07 NOTE — Progress Notes (Signed)
 Nursing Pain Medication Assessment:  Safety precautions to be maintained throughout the outpatient stay will include: orient to surroundings, keep bed in low position, maintain call bell within reach at all times, provide assistance with transfer out of bed and ambulation.  Medication Inspection Compliance: Pill count conducted under aseptic conditions, in front of the patient. Neither the pills nor the bottle was removed from the patient's sight at any time. Once count was completed pills were immediately returned to the patient in their original bottle.  Medication: Hydrocodone /APAP Pill/Patch Count: 9 of 90 pills/patches remain Pill/Patch Appearance: Markings consistent with prescribed medication Bottle Appearance: Standard pharmacy container. Clearly labeled. Filled Date: 8 / 4 / 2025 Last Medication intake:  Today

## 2024-01-25 ENCOUNTER — Ambulatory Visit: Attending: Nurse Practitioner | Admitting: Nurse Practitioner

## 2024-01-25 ENCOUNTER — Encounter: Payer: Self-pay | Admitting: Nurse Practitioner

## 2024-01-25 VITALS — BP 177/60 | HR 58 | Temp 97.2°F | Resp 16 | Ht 62.0 in | Wt 102.7 lb

## 2024-01-25 DIAGNOSIS — M47817 Spondylosis without myelopathy or radiculopathy, lumbosacral region: Secondary | ICD-10-CM | POA: Diagnosis present

## 2024-01-25 DIAGNOSIS — Z79899 Other long term (current) drug therapy: Secondary | ICD-10-CM | POA: Insufficient documentation

## 2024-01-25 DIAGNOSIS — M48061 Spinal stenosis, lumbar region without neurogenic claudication: Secondary | ICD-10-CM | POA: Insufficient documentation

## 2024-01-25 DIAGNOSIS — G894 Chronic pain syndrome: Secondary | ICD-10-CM | POA: Insufficient documentation

## 2024-01-25 MED ORDER — HYDROCODONE-ACETAMINOPHEN 5-325 MG PO TABS: 1.0000 | ORAL_TABLET | Freq: Four times a day (QID) | ORAL | 0 refills | Status: DC | PRN

## 2024-01-25 MED ORDER — HYDROCODONE-ACETAMINOPHEN 5-325 MG PO TABS: 1.0000 | ORAL_TABLET | Freq: Four times a day (QID) | ORAL | 0 refills | Status: AC | PRN

## 2024-01-25 MED ORDER — NALOXONE HCL 4 MG/0.1ML NA LIQD: 1.0000 | NASAL | 1 refills | Status: AC | PRN

## 2024-01-25 NOTE — Progress Notes (Addendum)
 PROVIDER NOTE: Interpretation of information contained herein should be left to medically-trained personnel. Specific patient instructions are provided elsewhere under Patient Instructions section of medical record. This document was created in part using AI and STT-dictation technology, any transcriptional errors that may result from this process are unintentional.  Patient: Caroline Campbell  Service: E/M   PCP: Lenon Layman ORN, MD  DOB: 1935/05/22  DOS: 01/25/2024  Provider: Emmy MARLA Blanch, NP  MRN: [id number]  Delivery: Face-to-face  Specialty: Interventional Pain Management  Type: Established Patient  Setting: Ambulatory outpatient facility  Specialty designation: 09  Referring Prov.: Lenon Layman ORN, MD  Location: Outpatient office facility       History of present illness (HPI) Caroline Campbell, a 88 y.o. year old female, is here today because of her Chronic pain syndrome [G89.4]. 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  Pain Assessment: Severity of Chronic pain is reported as a 4 /10. Location: Back Lower, Right, Left/to hips bilat, fingers hurt, not hands, just fingers. Onset: More than a month ago. Quality: Aching, Dull, Sharp. Timing: Constant. Modifying factor(s): meds, Goody's. Vitals:  height is 5' 2 (1.575 m) and weight is 102 lb 11.2 oz (46.6 kg). Her temperature is 97.2 F (36.2 C) (abnormal). Her blood pressure is 177/60 (abnormal)  and her pulse is 58 (abnormal). Her respiration is 16 and oxygen saturation is 100%.  BMI: Estimated body mass index is 18.78 kg/m as calculated from the following:   Height as of this encounter: 5' 2 (1.575 m).   Weight as of this encounter: 102 lb 11.2 oz (46.6 kg).  Last encounter: 09/06/2023. Last procedure: Visit date not found.  Reason for encounter: medication management.   Discussed the use of AI scribe software for clinical note transcription with the patient, who gave verbal consent to proceed.  History of Present Illness   Caroline Campbell is an 88 year old female who presents with unintentional weight loss and lower back pain.  The patient present today for medication management. Of note, she was prescribed hydrocodone  5-10 mg twice daily as needed by Dr. Marcelino.  She is taking it as prescribed without any side effects or adverse reaction.   She has experienced weight loss, which she attributes to not eating as she should rather than to any intentional effort. She attributes this to inadequate dietary intake, mentioning a preference for sweets and a lack of balanced meals.  She experiences persistent lower back pain that radiates to her hips. Her knees feel weak rather than painful, and she also reports pain in her fingers, particularly when they are hit against something.  She takes medication twice daily, which she believes has helped with her previous issue of severe diarrhea, now resulting in firmer stools with occasional constipation.  She is currently taking hydrocodone  and acetaminophen  without experiencing any side effects. She typically receives a three-month supply of her medication, but she did not receive the usual paperwork indicating her refill schedule during her last pharmacy visit.  No side effects or adverse reactions from her current medications.  Pharmacotherapy Assessment   Analgesic: Hydrocodone -acetaminophen  (Norco/Vicodin) 10 mg tablet every 12 hours as  needed for severe pain. MME=20 Monitoring: [place] PMP: PDMP reviewed during this encounter.       Pharmacotherapy: No side-effects or adverse reactions reported. Compliance: No problems identified. Effectiveness: Clinically acceptable.  Caroline Pulling, RN  01/25/2024  9:55 AM  Sign when Signing Visit Nursing Pain Medication Assessment:  Safety precautions to be maintained throughout the outpatient stay will include: orient to surroundings, keep bed in low position, maintain call bell within reach at all times, provide assistance with transfer out of bed and ambulation.  Medication Inspection Compliance: Pill count conducted under aseptic conditions, in front of the patient. Neither the pills nor the bottle was removed from the patient's sight at any time. Once count was completed pills were immediately returned to the patient in their original bottle.  Medication: Hydrocodone /APAP Pill/Patch Count: 44 of 120 pills/patches remain Pill/Patch Appearance: Markings consistent with prescribed medication Bottle Appearance: Standard pharmacy container. Clearly labeled. Filled Date: 19 / 62 / 2025 Last Medication intake:  Today    UDS:  Summary  Date Value Ref Range Status  09/06/2023 FINAL  Final    Comment:    ==================================================================== [employer] [facility] (MW) ==================================================================== Test                             Result       Flag       Units  Drug Present and Declared for Prescription Verification   Hydrocodone                     4830         EXPECTED   ng/mg creat   Hydromorphone                  545          EXPECTED   ng/mg creat   Dihydrocodeine                 288          EXPECTED   ng/mg creat   Norhydrocodone                 4952         EXPECTED   ng/mg creat    Sources of hydrocodone  include scheduled prescription medications.    Hydromorphone, dihydrocodeine and norhydrocodone are expected     metabolites of hydrocodone . Hydromorphone and dihydrocodeine are    also available as scheduled prescription medications.  ==================================================================== Test                      Result    Flag   Units      Ref Range   Creatinine              69               mg/dL      >=79 ==================================================================== Declared Medications:  The flagging and interpretation on this report are based on the  following declared medications.  Unexpected results may arise from  inaccuracies in the declared medications.   **Note: The testing scope of this panel includes these medications:   Hydrocodone  (Norco)   **Note: The testing scope of this panel does not include the  following reported medications:   Acetaminophen   Acetaminophen  (Norco)  Aspirin  Atorvastatin (Lipitor)  Chondroitin  Cyanocobalamin  Gabapentin (Neurontin)  Glucosamine  Lidocaine   Metoprolol (Toprol)  Omega-3 Fatty Acids  Pantoprazole (Protonix)  Topical  Ubiquinone (CoQ10)  Vitamin E ==================================================================== For clinical consultation, please call [phone]. ====================================================================     No results found for: CBDTHCR No results found for: D8THCCBX No results found for: D9THCCBX  ROS  Constitutional: Denies any fever or chills Gastrointestinal: No reported hemesis, hematochezia, vomiting, or acute GI distress Musculoskeletal: Low back pain  Neurological: No reported episodes of acute onset apraxia, aphasia, dysarthria, agnosia, amnesia, paralysis, loss of coordination, or loss of consciousness  Medication Review  Aspirin-Acetaminophen , Coenzyme Q10, HYDROcodone -acetaminophen , Omega-3 Fatty Acids, Vitamin E Acetate, atorvastatin, clobetasol  ointment, gabapentin, glucosamine-chondroitin, lidocaine , metoprolol succinate, naloxone, pantoprazole,  and vitamin B-12  History Review  Allergy: Caroline Campbell is allergic to panmycin [tetracycline], crestor [rosuvastatin calcium], and dexlansoprazole. Drug: Caroline Campbell  reports no history of drug use. Alcohol:  reports current alcohol use. Tobacco:  reports that she has quit smoking. She has never used smokeless tobacco. Social: Caroline Campbell  reports that she has quit smoking. She has never used smokeless tobacco. She reports current alcohol use. She reports that she does not use drugs. Medical:  has a past medical history of Arthritis, Cancer (HCC), GERD (gastroesophageal reflux disease), Hyperlipidemia, Hypertension, Lumbar disc disease, and PMR (polymyalgia rheumatica). Surgical: Caroline Campbell  has a past surgical history that includes Abdominal hysterectomy; Colonoscopy with propofol  (N/A, 09/06/2017); and Esophagogastroduodenoscopy (egd) with propofol  (N/A, 09/06/2017). Family: family history is not on file.  Laboratory Chemistry Profile   Renal Lab Results  Component Value Date   BUN 12 04/15/2021   CREATININE 0.70 04/15/2021   BCR 17 04/15/2021    Hepatic Lab Results  Component Value Date   AST 20 04/15/2021   ALBUMIN 4.6 04/15/2021   ALKPHOS 72 04/15/2021    Electrolytes Lab Results  Component Value Date   NA 143 04/15/2021   K 4.6 04/15/2021   CL 106 04/15/2021   CALCIUM 9.8 04/15/2021   MG 2.1 04/15/2021    Bone Lab Results  Component Value Date   25OHVITD1 54 04/15/2021   25OHVITD2 <1.0 04/15/2021   25OHVITD3 54 04/15/2021    Inflammation (CRP: Acute Phase) (ESR: Chronic Phase) Lab Results  Component Value Date   CRP 1 04/15/2021   ESRSEDRATE 13 04/15/2021         Note: Above Lab results reviewed.  Recent Imaging Review  DG Lumbar Spine Complete W/Bend CLINICAL DATA:  Low back pain  EXAM: LUMBAR SPINE - COMPLETE WITH BENDING VIEWS  COMPARISON:  X-ray lumbar spine 05/01/2012  FINDINGS: Markedly limited evaluation due to overlapping osseous  structures and overlying soft tissues.  Five non-rib-bearing lumbar vertebral bodies. Slight levocurvature of the lumbar spine centered at the L2-L3 level. Grade 1 anterolisthesis of L5 on S1. No change in alignment on flexion and extension views. Multilevel degenerative changes of the spine. There is no evidence of lumbar spine fracture. Alignment is normal. Intervertebral disc spaces are maintained. Aortic calcification.  IMPRESSION: 1. No acute displaced fracture or traumatic listhesis of the lumbar spine. 2. Grade 1 anterolisthesis of L5 on S1 with no change in alignment on extension and flexion views. 3.  Aortic Atherosclerosis (ICD10-I70.0).  Electronically Signed   By: Morgane  Naveau M.D.   On: 04/15/2021 20:28 DG Thoracic Spine W/Swimmers CLINICAL DATA:  Back pain.  EXAM: THORACIC SPINE - 3 VIEWS  COMPARISON:  Chest radiograph dated 05/01/2012.  FINDINGS: No acute fracture or subluxation. The bones are osteopenic. Multilevel degenerative  changes and scoliosis. Atherosclerotic calcification of the aorta. The soft tissues are unremarkable.  IMPRESSION: No acute/traumatic thoracic spine pathology.  Electronically Signed   By: Vanetta Chou M.D.   On: 04/15/2021 20:22 DG Cervical Spine With Flex & Extend CLINICAL DATA:  Cervicalgia  EXAM: CERVICAL SPINE COMPLETE WITH FLEXION AND EXTENSION VIEWS  COMPARISON:  X-ray cervical spine 01/08/2013  FINDINGS: On the lateral view the cervical spine is visualized to the level of C7. Normal cervical lordosis. On flexion view there is development of grade 1 anterolisthesis of C2 on C3, C3 on C4, C4 on C5.  Dens is well positioned between the lateral masses of C1. There is limited evaluation of the dens for acute fracture on the open-mouth view due to overlying osseous structures.  Diffusely decreased bone density. Mild-to-moderate degenerative change of the spine at the C5 through C7 levels. Osseous  neural foraminal stenosis at the C5-C6 level on the left. No acute displaced fracture is detected.No aggressive-appearing focal osseous lesions.  Pre-vertebral soft tissues are within normal limits.  IMPRESSION: 1. On flexion view there is development of grade 1 anterolisthesis of C2 on C3, C3 on C4, C4 on C5. 2. Mild-to-moderate degenerative changes of the C5 through C7 levels with associated osseous neural foraminal stenosis at the C5-C6 level on the left. 3. No acute displaced fracture or traumatic listhesis of the cervical spine.  Electronically Signed   By: Morgane  Naveau M.D.   On: 04/15/2021 18:46 DG HIP UNILAT W OR W/O PELVIS 2-3 VIEWS RIGHT CLINICAL DATA:  Right hip pain  EXAM: DG HIP (WITH OR WITHOUT PELVIS) 2-3V RIGHT  COMPARISON:  None.  FINDINGS: No acute fracture or dislocation identified in the right hip. Moderate joint space narrowing with acetabular subchondral sclerosis and small marginal osteophyte formation. No acute fracture identified in the pelvis. Bones are osteopenic.  IMPRESSION: Chronic changes with no acute osseous abnormality identified.  Electronically Signed   By: Catarina Pouch M.D.   On: 04/15/2021 16:45 DG Shoulder Right CLINICAL DATA:  Right shoulder pain  EXAM: RIGHT SHOULDER - 2+ VIEW  COMPARISON:  None.  FINDINGS: No acute fracture or dislocation identified. Mild joint space narrowing. No suspicious bony lesions identified.  IMPRESSION: No acute osseous abnormality identified.  Electronically Signed   By: Catarina Pouch M.D.   On: 04/15/2021 16:44 Note: Reviewed        Physical Exam  Vitals: BP (!) 177/60   Pulse (!) 58   Temp (!) 97.2 F (36.2 C)   Resp 16   Ht 5' 2 (1.575 m)   Wt 102 lb 11.2 oz (46.6 kg)   SpO2 100%   BMI 18.78 kg/m  BMI: Estimated body mass index is 18.78 kg/m as calculated from the following:   Height as of this encounter: 5' 2 (1.575 m).   Weight as of this encounter: 102 lb  11.2 oz (46.6 kg). Ideal: Ideal body weight: 50.1 kg (110 lb 7.2 oz) General appearance: Well nourished, well developed, and well hydrated. In no apparent acute distress Mental status: Alert, oriented x 3 (person, place, & time)       Respiratory: No evidence of acute respiratory distress Eyes: PERLA  Musculoskeletal: +LBP Assessment   Diagnosis Status  1. Chronic pain syndrome   2. 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 Medication management   4. Lumbosacral facet arthropathy   5. 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  Plan of Care  Problem-specific:  Assessment and Plan    Chronic lower back pain Chronic lower back pain with knee weakness, no leg radiation.  - Continue hydrocodone -acetaminophen , two pills twice daily. - Send medication refill to [facility] for a three-month supply.  Medications   HYDROcodone -acetaminophen  (NORCO/VICODIN) 5-325 MG tablet    Sig: Take 1 tablet by mouth every 6 (six) hours as needed for severe pain (pain score 7-10). Must last 30 days.    Dispense:  120 tablet    Refill:  0    Chronic Pain: STOP Act (Not applicable) Fill 1 day early if closed on refill date. Avoid benzodiazepines within 8 hours of opioids   HYDROcodone -acetaminophen  (NORCO/VICODIN) 5-325 MG tablet    Sig: Take 1 tablet by mouth every 6 (six) hours as needed for severe pain (pain score 7-10). Must last 30 days.    Dispense:  120 tablet    Refill:  0    Chronic Pain: STOP Act (Not applicable) Fill 1 day early if closed on refill date. Avoid benzodiazepines within 8 hours of opioids   HYDROcodone -acetaminophen  (NORCO/VICODIN) 5-325 MG tablet    Sig: Take 1 tablet by mouth every 6 (six) hours as needed for severe pain (pain score 7-10). Must last 30 days.    Dispense:  120 tablet    Refill:  0    Chronic Pain: STOP Act (Not applicable) Fill 1 day early if closed on refill date. Avoid  benzodiazepines within 8 hours of opioids   naloxone (NARCAN) nasal spray 4 mg/0.1 mL    Sig: Place 1 spray into the nose as needed for up to 365 doses (for opioid-induced respiratory depresssion). In case of emergency (overdose), spray once into each nostril. If no response within 3 minutes, repeat application and call 911.    Dispense:  1 each    Refill:  1    Knee weakness Knees weak, not painful, distinct from back pain.  Chronic pain syndrome: Patient's pain is well-controlled with hydrocodone , will continue on current medication regimen.  Prescribing drug monitoring (PMP) reviewed, findings consistent with the use of prescribed medication and no evidence of narcotic misuse or abuse.  Urine drug screening (UDS) up to date and consistent with the use of prescribed medication.  Schedule follow-up in 90 days for medication management.  Medication management:   HYDROcodone -acetaminophen  (NORCO/VICODIN) 5-325 MG tablet    Sig: Take 1 tablet by mouth every 6 (six) hours as needed for severe pain (pain score 7-10). Must last 30 days.    Dispense:  120 tablet    Refill:  0    Chronic Pain: STOP Act (Not applicable) Fill 1 day early if closed on refill date. Avoid benzodiazepines within 8 hours of opioids   HYDROcodone -acetaminophen  (NORCO/VICODIN) 5-325 MG tablet    Sig: Take 1 tablet by mouth every 6 (six) hours as needed for severe pain (pain score 7-10). Must last 30 days.    Dispense:  120 tablet    Refill:  0    Chronic Pain: STOP Act (Not applicable) Fill 1 day early if closed on refill date. Avoid benzodiazepines within 8 hours of opioids   HYDROcodone -acetaminophen  (NORCO/VICODIN) 5-325 MG tablet    Sig: Take 1 tablet by mouth every 6 (six) hours as needed for severe pain (pain score 7-10). Must last 30 days.    Dispense:  120 tablet    Refill:  0    Chronic Pain: STOP Act (Not applicable) Fill 1 day early  if closed on refill date. Avoid benzodiazepines within 8 hours of opioids     Unintentional weight loss due to poor oral intake Unintentional weight loss with increased craving for sweets, no significant underlying medical issues. - Advise discussion of weight loss with primary care provider during upcoming appointment. - Suggest referral to a nutritionist for dietary guidance. - Encourage balanced diet including vegetables and fruits to stabilize weight.       Caroline Campbell has a current medication list which includes the following long-term medication(s): atorvastatin, gabapentin, metoprolol succinate, and pantoprazole.  Pharmacotherapy (Medications Ordered): Meds ordered this encounter  Medications   HYDROcodone -acetaminophen  (NORCO/VICODIN) 5-325 MG tablet    Sig: Take 1 tablet by mouth every 6 (six) hours as needed for severe pain (pain score 7-10). Must last 30 days.    Dispense:  120 tablet    Refill:  0    Chronic Pain: STOP Act (Not applicable) Fill 1 day early if closed on refill date. Avoid benzodiazepines within 8 hours of opioids   HYDROcodone -acetaminophen  (NORCO/VICODIN) 5-325 MG tablet    Sig: Take 1 tablet by mouth every 6 (six) hours as needed for severe pain (pain score 7-10). Must last 30 days.    Dispense:  120 tablet    Refill:  0    Chronic Pain: STOP Act (Not applicable) Fill 1 day early if closed on refill date. Avoid benzodiazepines within 8 hours of opioids   HYDROcodone -acetaminophen  (NORCO/VICODIN) 5-325 MG tablet    Sig: Take 1 tablet by mouth every 6 (six) hours as needed for severe pain (pain score 7-10). Must last 30 days.    Dispense:  120 tablet    Refill:  0    Chronic Pain: STOP Act (Not applicable) Fill 1 day early if closed on refill date. Avoid benzodiazepines within 8 hours of opioids   naloxone (NARCAN) nasal spray 4 mg/0.1 mL    Sig: Place 1 spray into the nose as needed for up to 365 doses (for opioid-induced respiratory depresssion). In case of emergency (overdose), spray once into each nostril. If no  response within 3 minutes, repeat application and call 911.    Dispense:  1 each    Refill:  1    Instruct patient in proper use of device.   Orders:  No orders of the defined types were placed in this encounter.       Return in about 3 months (around 04/26/2024) for (F2F), (MM), Emmy Blanch NP.    Recent Visits Date Type Provider Dept  12/07/23 Office Visit Marcelino Nurse, MD [facility]  Showing recent visits within past 90 days and meeting all other requirements Today's Visits Date Type Provider Dept  01/25/24 Office Visit Eartha Vonbehren K, NP [facility]  Showing today's visits and meeting all other requirements Future Appointments Date Type Provider Dept  04/17/24 Appointment Lella Mullany K, NP [facility]  Showing future appointments within next 90 days and meeting all other requirements  I discussed the assessment and treatment plan with the patient. The patient was provided an opportunity to ask questions and all were answered. The patient agreed with the plan and demonstrated an understanding of the instructions.  Patient advised to call back or seek an in-person evaluation if the symptoms or condition worsens.  I personally spent a total of 30 minutes in the care of the patient today including preparing to see the patient, getting/reviewing separately obtained history, performing a medically appropriate exam/evaluation, counseling and  educating, placing orders, referring and communicating with other health care professionals, documenting clinical information in the EHR, independently interpreting results, communicating results, and coordinating care.   Note by: Jacquelene Kopecky K Morna Flud, NP (TTS and AI technology used. I apologize for any typographical errors that were not detected and corrected.) Date: 01/25/2024; Time: 12:20 PM

## 2024-01-25 NOTE — Progress Notes (Signed)
 Nursing Pain Medication Assessment:  Safety precautions to be maintained throughout the outpatient stay will include: orient to surroundings, keep bed in low position, maintain call bell within reach at all times, provide assistance with transfer out of bed and ambulation.  Medication Inspection Compliance: Pill count conducted under aseptic conditions, in front of the patient. Neither the pills nor the bottle was removed from the patient's sight at any time. Once count was completed pills were immediately returned to the patient in their original bottle.  Medication: Hydrocodone /APAP Pill/Patch Count: 44 of 120 pills/patches remain Pill/Patch Appearance: Markings consistent with prescribed medication Bottle Appearance: Standard pharmacy container. Clearly labeled. Filled Date: 32 / 33 / 2025 Last Medication intake:  Today

## 2024-02-22 ENCOUNTER — Ambulatory Visit: Payer: Medicare Other | Admitting: Dermatology

## 2024-03-13 ENCOUNTER — Telehealth: Payer: Self-pay | Admitting: Student in an Organized Health Care Education/Training Program

## 2024-03-13 NOTE — Telephone Encounter (Signed)
 Pt stated that the hydrocodone  she needs to pickup her meds before christmas day. The pharmacy is closed. Pt wanted to know could her date be changed to earlier.

## 2024-03-13 NOTE — Telephone Encounter (Signed)
 Patient aware

## 2024-03-13 NOTE — Telephone Encounter (Signed)
 Called to pharmacy to confirm that she may pickup Rx on 04/04/24 d/t closure on12/25/25

## 2024-04-14 NOTE — Progress Notes (Unsigned)
 PROVIDER NOTE: Interpretation of information contained herein should be left to medically-trained personnel. Specific patient instructions are provided elsewhere under Patient Instructions section of medical record. This document was created in part using AI and STT-dictation technology, any transcriptional errors that may result from this process are unintentional.  Patient: Caroline Campbell  Service: E/M   PCP: Caroline Campbell ORN, MD  DOB: 18-Aug-1935  DOS: 04/17/2024  Provider: Emmy MARLA Blanch, NP  MRN: [id number]  Delivery: Face-to-face  Specialty: Interventional Pain Management  Type: Established Patient  Setting: Ambulatory outpatient facility  Specialty designation: 09  Referring Prov.: Caroline Campbell ORN, MD  Location: Outpatient office facility       History of present illness (HPI) [name], a 89 y.o. year old female, is here today because of her No primary diagnosis found.. [name] primary complain today is No chief complaint on file.  Pertinent problems: 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  Pain Assessment: Severity of   is reported as a  /10. Location:    / . Onset:  . Quality:  . Timing:  . Modifying factor(s):  Caroline Campbell Vitals:  vitals were not  taken for this visit.  BMI: Estimated body mass index is 18.78 kg/m as calculated from the following:   Height as of 01/25/24: 5' 2 (1.575 m).   Weight as of 01/25/24: 102 lb 11.2 oz (46.6 kg).  Last encounter: 01/25/2024. Last procedure: Visit date not found.  Reason for encounter: {Blank single:19197::medication management,post-procedure evaluation and assessment,both, medication management and post-procedure evaluation and assessment,evaluation of worsening, or previously known (established) problem,patient-requested evaluation,follow-up evaluation,evaluation for possible interventional PM therapy/treatment,evaluation of new problem, *** }.   Discussed the use of AI scribe software for clinical note transcription with the patient, who gave verbal consent to proceed.  History of Present Illness           Pharmacotherapy Assessment   Analgesic: Hydrocodone -acetaminophen  (Norco/Vicodin) 10 mg tablet every 12 hours as needed for severe pain. MME=20 Monitoring: [place] PMP: PDMP reviewed during this encounter.       Pharmacotherapy: No side-effects or adverse reactions reported. Compliance: No problems identified. Effectiveness: Clinically acceptable.  Caroline Pulling, RN  01/25/2024  9:55 AM  Sign when Signing Visit Nursing Pain Medication Assessment:  Safety precautions to be maintained throughout the outpatient stay will include: orient to surroundings, keep bed in low position, maintain call bell within reach at all times, provide assistance with transfer out of bed and ambulation.  Medication Inspection Compliance: Pill count conducted under aseptic conditions, in front of the patient. Neither the pills nor the bottle was removed from the patient's sight at any time. Once count was completed pills were immediately returned to the patient in their original bottle.  Medication: Hydrocodone /APAP Pill/Patch Count: 44 of 120 pills/patches remain Pill/Patch Appearance:  Markings consistent with prescribed medication Bottle Appearance: Standard pharmacy container. Clearly labeled. Filled Date: 25 / 37 / 2025  Last Medication intake:  Today    UDS:  Summary  Date Value Ref Range Status  09/06/2023 FINAL  Final    Comment:    ==================================================================== [employer] 13 (MW) ==================================================================== Test                             Result       Flag       Units  Drug Present and Declared for Prescription Verification   Hydrocodone                     4830         EXPECTED   ng/mg creat   Hydromorphone                  545          EXPECTED   ng/mg creat   Dihydrocodeine                 288          EXPECTED   ng/mg creat   Norhydrocodone                 4952         EXPECTED   ng/mg creat    Sources of hydrocodone  include scheduled prescription medications.    Hydromorphone, dihydrocodeine and norhydrocodone are expected    metabolites of hydrocodone . Hydromorphone and dihydrocodeine are    also available as scheduled prescription medications.  ==================================================================== Test                      Result    Flag   Units      Ref Range   Creatinine              69               mg/dL      >=79 ==================================================================== Declared Medications:  The flagging and interpretation on this report are based on the  following declared medications.  Unexpected results may arise from  inaccuracies in the declared medications.   **Note: The testing scope of this panel includes these medications:   Hydrocodone  (Norco)   **Note: The testing scope of this panel does not include the  following reported medications:   Acetaminophen   Acetaminophen  (Norco)  Aspirin  Atorvastatin (Lipitor)  Chondroitin  Cyanocobalamin  Gabapentin (Neurontin)  Glucosamine  Lidocaine   Metoprolol (Toprol)   Omega-3 Fatty Acids  Pantoprazole (Protonix)  Topical  Ubiquinone (CoQ10)  Vitamin E ==================================================================== For clinical consultation, please call [phone]. ====================================================================     No results found for: CBDTHCR No results found for: D8THCCBX No results found for: D9THCCBX  ROS  Constitutional: Denies any fever or chills Gastrointestinal: No reported hemesis, hematochezia, vomiting, or acute GI distress Musculoskeletal: Low back pain  Neurological: No reported episodes of acute onset apraxia, aphasia, dysarthria, agnosia, amnesia, paralysis, loss of coordination, or loss of consciousness  Medication Review  Aspirin-Acetaminophen , Coenzyme Q10, HYDROcodone -acetaminophen , Omega-3 Fatty Acids, Vitamin E Acetate, atorvastatin, clobetasol  ointment, gabapentin, glucosamine-chondroitin, lidocaine , metoprolol succinate, naloxone , pantoprazole, and vitamin B-12  History Review  Allergy: Ms. Jablonski is allergic to panmycin [tetracycline], crestor [rosuvastatin calcium], and dexlansoprazole. Drug: Ms. Cooner  reports no history of drug use. Alcohol:  reports current alcohol use. Tobacco:  reports that she has quit smoking. She has never used smokeless tobacco. Social: Ms. Dizdarevic  reports that she has quit  smoking. She has never used smokeless tobacco. She reports current alcohol use. She reports that she does not use drugs. Medical:  has a past medical history of Arthritis, Cancer (HCC), GERD (gastroesophageal reflux disease), Hyperlipidemia, Hypertension, Lumbar disc disease, and PMR (polymyalgia rheumatica). Surgical: Ms. Rumberger  has a past surgical history that includes Abdominal hysterectomy; Colonoscopy with propofol  (N/A, 09/06/2017); and Esophagogastroduodenoscopy (egd) with propofol  (N/A, 09/06/2017). Family: family history is not on file.  Laboratory Chemistry Profile    Renal Lab Results  Component Value Date   BUN 12 04/15/2021   CREATININE 0.70 04/15/2021   BCR 17 04/15/2021    Hepatic Lab Results  Component Value Date   AST 20 04/15/2021   ALBUMIN 4.6 04/15/2021   ALKPHOS 72 04/15/2021    Electrolytes Lab Results  Component Value Date   NA 143 04/15/2021   K 4.6 04/15/2021   CL 106 04/15/2021   CALCIUM 9.8 04/15/2021   MG 2.1 04/15/2021    Bone Lab Results  Component Value Date   25OHVITD1 54 04/15/2021   25OHVITD2 <1.0 04/15/2021   25OHVITD3 54 04/15/2021    Inflammation (CRP: Acute Phase) (ESR: Chronic Phase) Lab Results  Component Value Date   CRP 1 04/15/2021   ESRSEDRATE 13 04/15/2021         Note: Above Lab results reviewed.  Recent Imaging Review  DG Lumbar Spine Complete W/Bend CLINICAL DATA:  Low back pain  EXAM: LUMBAR SPINE - COMPLETE WITH BENDING VIEWS  COMPARISON:  X-ray lumbar spine 05/01/2012  FINDINGS: Markedly limited evaluation due to overlapping osseous structures and overlying soft tissues.  Five non-rib-bearing lumbar vertebral bodies. Slight levocurvature of the lumbar spine centered at the L2-L3 level. Grade 1 anterolisthesis of L5 on S1. No change in alignment on flexion and extension views. Multilevel degenerative changes of the spine. There is no evidence of lumbar spine fracture. Alignment is normal. Intervertebral disc spaces are maintained. Aortic calcification.  IMPRESSION: 1. No acute displaced fracture or traumatic listhesis of the lumbar spine. 2. Grade 1 anterolisthesis of L5 on S1 with no change in alignment on extension and flexion views. 3.  Aortic Atherosclerosis (ICD10-I70.0).  Electronically Signed   By: Morgane  Naveau M.D.   On: 04/15/2021 20:28 DG Thoracic Spine W/Swimmers CLINICAL DATA:  Back pain.  EXAM: THORACIC SPINE - 3 VIEWS  COMPARISON:  Chest radiograph dated 05/01/2012.  FINDINGS: No acute fracture or subluxation. The bones are  osteopenic. Multilevel degenerative changes and scoliosis. Atherosclerotic calcification of the aorta. The soft tissues are unremarkable.  IMPRESSION: No acute/traumatic thoracic spine pathology.  Electronically Signed   By: Vanetta Chou M.D.   On: 04/15/2021 20:22 DG Cervical Spine With Flex & Extend CLINICAL DATA:  Cervicalgia  EXAM: CERVICAL SPINE COMPLETE WITH FLEXION AND EXTENSION VIEWS  COMPARISON:  X-ray cervical spine 01/08/2013  FINDINGS: On the lateral view the cervical spine is visualized to the level of C7. Normal cervical lordosis. On flexion view there is development of grade 1 anterolisthesis of C2 on C3, C3 on C4, C4 on C5.  Dens is well positioned between the lateral masses of C1. There is limited evaluation of the dens for acute fracture on the open-mouth view due to overlying osseous structures.  Diffusely decreased bone density. Mild-to-moderate degenerative change of the spine at the C5 through C7 levels. Osseous neural foraminal stenosis at the C5-C6 level on the left. No acute displaced fracture is detected.No aggressive-appearing focal osseous lesions.  Pre-vertebral soft tissues are within normal limits.  IMPRESSION: 1. On flexion view there is development of grade 1 anterolisthesis of C2 on C3, C3 on C4, C4 on C5. 2. Mild-to-moderate degenerative changes of the C5 through C7 levels with associated osseous neural foraminal stenosis at the C5-C6 level on the left. 3. No acute displaced fracture or traumatic listhesis of the cervical spine.  Electronically Signed   By: Morgane  Naveau M.D.   On: 04/15/2021 18:46 DG HIP UNILAT W OR W/O PELVIS 2-3 VIEWS RIGHT CLINICAL DATA:  Right hip pain  EXAM: DG HIP (WITH OR WITHOUT PELVIS) 2-3V RIGHT  COMPARISON:  None.  FINDINGS: No acute fracture or dislocation identified in the right hip. Moderate joint space narrowing with acetabular subchondral sclerosis and small marginal osteophyte  formation. No acute fracture identified in the pelvis. Bones are osteopenic.  IMPRESSION: Chronic changes with no acute osseous abnormality identified.  Electronically Signed   By: Catarina Pouch M.D.   On: 04/15/2021 16:45 DG Shoulder Right CLINICAL DATA:  Right shoulder pain  EXAM: RIGHT SHOULDER - 2+ VIEW  COMPARISON:  None.  FINDINGS: No acute fracture or dislocation identified. Mild joint space narrowing. No suspicious bony lesions identified.  IMPRESSION: No acute osseous abnormality identified.  Electronically Signed   By: Catarina Pouch M.D.   On: 04/15/2021 16:44 Note: Reviewed        Physical Exam  Vitals: BP (!) 177/60   Pulse (!) 58   Temp (!) 97.2 F (36.2 C)   Resp 16   Ht 5' 2 (1.575 m)   Wt 102 lb 11.2 oz (46.6 kg)   SpO2 100%   BMI 18.78 kg/m  BMI: Estimated body mass index is 18.78 kg/m as calculated from the following:   Height as of this encounter: 5' 2 (1.575 m).   Weight as of this encounter: 102 lb 11.2 oz (46.6 kg). Ideal: Ideal body weight: 50.1 kg (110 lb 7.2 oz) General appearance: Well nourished, well developed, and well hydrated. In no apparent acute distress Mental status: Alert, oriented x 3 (person, place, & time)       Respiratory: No evidence of acute respiratory distress Eyes: PERLA  Musculoskeletal: +LBP Assessment   Diagnosis Status  1. Chronic pain syndrome   2. 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 Medication management   4. Lumbosacral facet arthropathy   5. 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  Plan of Care  Problem-specific:  Assessment and Plan    Monitoring: [place] PMP: PDMP not reviewed this encounter. {Blank single:19197::Unable to conduct review of the controlled substance reporting system due to technological failure.,     } Pharmacotherapy: {Blank single:19197::Opioid-induced constipation  (OIC)(K59.03, T40.2X5A),No side-effects or adverse reactions reported.} Compliance: {Blank single:19197::Medication agreement violation - unsanctioned acquisition/use of additional opioid-containing medication,No problems identified.} Effectiveness: {Blank single:19197::Clinically acceptable.}  No notes on file  UDS:  Summary  Date Value Ref Range Status  09/06/2023 FINAL  Final    Comment:    ==================================================================== [employer] 13 (MW) ==================================================================== Test                             Result       Flag       Units  Drug Present and Declared for Prescription Verification   Hydrocodone                     4830  EXPECTED   ng/mg creat   Hydromorphone                  545          EXPECTED   ng/mg creat   Dihydrocodeine                 288          EXPECTED   ng/mg creat   Norhydrocodone                 4952         EXPECTED   ng/mg creat    Sources of hydrocodone  include scheduled prescription medications.    Hydromorphone, dihydrocodeine and norhydrocodone are expected    metabolites of hydrocodone . Hydromorphone and dihydrocodeine are    also available as scheduled prescription medications.  ==================================================================== Test                      Result    Flag   Units      Ref Range   Creatinine              69               mg/dL      >=79 ==================================================================== Declared Medications:  The flagging and interpretation on this report are based on the  following declared medications.  Unexpected results may arise from  inaccuracies in the declared medications.   **Note: The testing scope of this panel includes these medications:   Hydrocodone  (Norco)   **Note: The testing scope of this panel does not include the  following reported medications:   Acetaminophen   Acetaminophen   (Norco)  Aspirin  Atorvastatin (Lipitor)  Chondroitin  Cyanocobalamin  Gabapentin (Neurontin)  Glucosamine  Lidocaine   Metoprolol (Toprol)  Omega-3 Fatty Acids  Pantoprazole (Protonix)  Topical  Ubiquinone (CoQ10)  Vitamin E ==================================================================== For clinical consultation, please call [phone]. ====================================================================     No results found for: CBDTHCR No results found for: D8THCCBX No results found for: D9THCCBX  ROS  Constitutional: {Blank single:19197::Denies any fever or chills} Gastrointestinal: {Blank single:19197::No reported hemesis, hematochezia, vomiting, or acute GI distress} Musculoskeletal: {Blank single:19197::Denies any acute onset joint swelling, redness, loss of ROM, or weakness} Neurological: {Blank single:19197::No reported episodes of acute onset apraxia, aphasia, dysarthria, agnosia, amnesia, paralysis, loss of coordination, or loss of consciousness}  Medication Review  Aspirin-Acetaminophen , Coenzyme Q10, HYDROcodone -acetaminophen , Omega-3 Fatty Acids, Vitamin E Acetate, atorvastatin, clobetasol  ointment, gabapentin, glucosamine-chondroitin, lidocaine , metoprolol succinate, naloxone , pantoprazole, and vitamin B-12  History Review  Allergy: Ms. Guerin is allergic to panmycin [tetracycline], crestor [rosuvastatin calcium], and dexlansoprazole. Drug: Ms. Wiltse  reports no history of drug use. Alcohol:  reports current alcohol use. Tobacco:  reports that she has quit smoking. She has never used smokeless tobacco. Social: Ms. Looper  reports that she has quit smoking. She has never used smokeless tobacco. She reports current alcohol use. She reports that she does not use drugs. Medical:  has a past medical history of Arthritis, Cancer (HCC), GERD (gastroesophageal reflux disease), Hyperlipidemia, Hypertension, Lumbar disc disease, and PMR  (polymyalgia rheumatica). Surgical: Ms. Zobrist  has a past surgical history that includes Abdominal hysterectomy; Colonoscopy with propofol  (N/A, 09/06/2017); and Esophagogastroduodenoscopy (egd) with propofol  (N/A, 09/06/2017). Family: family history is not on file.  Laboratory Chemistry Profile   Renal Lab Results  Component Value Date   BUN 12 04/15/2021   CREATININE 0.70 04/15/2021   BCR 17 04/15/2021  Hepatic Lab Results  Component Value Date   AST 20 04/15/2021   ALBUMIN 4.6 04/15/2021   ALKPHOS 72 04/15/2021    Electrolytes Lab Results  Component Value Date   NA 143 04/15/2021   K 4.6 04/15/2021   CL 106 04/15/2021   CALCIUM 9.8 04/15/2021   MG 2.1 04/15/2021    Bone Lab Results  Component Value Date   25OHVITD1 54 04/15/2021   25OHVITD2 <1.0 04/15/2021   25OHVITD3 54 04/15/2021    Inflammation (CRP: Acute Phase) (ESR: Chronic Phase) Lab Results  Component Value Date   CRP 1 04/15/2021   ESRSEDRATE 13 04/15/2021         Note: {Blank single:19197::Ms. Amescua indicates labs done and monitored by primary care practitioner using a non-[facility] EMR system,No results found under the [facility] electronic medical record,Patient noncompliant with Lab Work orders.,Results made available to patient.,Lab results reviewed and made available to patient.,Lab results reviewed and explained to patient in Campbell's terms.,Above Lab results reviewed.}  Recent Imaging Review  DG Lumbar Spine Complete W/Bend CLINICAL DATA:  Low back pain  EXAM: LUMBAR SPINE - COMPLETE WITH BENDING VIEWS  COMPARISON:  X-ray lumbar spine 05/01/2012  FINDINGS: Markedly limited evaluation due to overlapping osseous structures and overlying soft tissues.  Five non-rib-bearing lumbar vertebral bodies. Slight levocurvature of the lumbar spine centered at the L2-L3 level. Grade 1 anterolisthesis of L5 on S1. No change in alignment on flexion and extension views. Multilevel  degenerative changes of the spine. There is no evidence of lumbar spine fracture. Alignment is normal. Intervertebral disc spaces are maintained. Aortic calcification.  IMPRESSION: 1. No acute displaced fracture or traumatic listhesis of the lumbar spine. 2. Grade 1 anterolisthesis of L5 on S1 with no change in alignment on extension and flexion views. 3.  Aortic Atherosclerosis (ICD10-I70.0).  Electronically Signed   By: Morgane  Naveau M.D.   On: 04/15/2021 20:28 DG Thoracic Spine W/Swimmers CLINICAL DATA:  Back pain.  EXAM: THORACIC SPINE - 3 VIEWS  COMPARISON:  Chest radiograph dated 05/01/2012.  FINDINGS: No acute fracture or subluxation. The bones are osteopenic. Multilevel degenerative changes and scoliosis. Atherosclerotic calcification of the aorta. The soft tissues are unremarkable.  IMPRESSION: No acute/traumatic thoracic spine pathology.  Electronically Signed   By: Vanetta Chou M.D.   On: 04/15/2021 20:22 DG Cervical Spine With Flex & Extend CLINICAL DATA:  Cervicalgia  EXAM: CERVICAL SPINE COMPLETE WITH FLEXION AND EXTENSION VIEWS  COMPARISON:  X-ray cervical spine 01/08/2013  FINDINGS: On the lateral view the cervical spine is visualized to the level of C7. Normal cervical lordosis. On flexion view there is development of grade 1 anterolisthesis of C2 on C3, C3 on C4, C4 on C5.  Dens is well positioned between the lateral masses of C1. There is limited evaluation of the dens for acute fracture on the open-mouth view due to overlying osseous structures.  Diffusely decreased bone density. Mild-to-moderate degenerative change of the spine at the C5 through C7 levels. Osseous neural foraminal stenosis at the C5-C6 level on the left. No acute displaced fracture is detected.No aggressive-appearing focal osseous lesions.  Pre-vertebral soft tissues are within normal limits.  IMPRESSION: 1. On flexion view there is development of grade 1  anterolisthesis of C2 on C3, C3 on C4, C4 on C5. 2. Mild-to-moderate degenerative changes of the C5 through C7 levels with associated osseous neural foraminal stenosis at the C5-C6 level on the left. 3. No acute displaced fracture or traumatic listhesis of the cervical spine.  Electronically Signed   By: Morgane  Naveau M.D.   On: 04/15/2021 18:46 DG HIP UNILAT W OR W/O PELVIS 2-3 VIEWS RIGHT CLINICAL DATA:  Right hip pain  EXAM: DG HIP (WITH OR WITHOUT PELVIS) 2-3V RIGHT  COMPARISON:  None.  FINDINGS: No acute fracture or dislocation identified in the right hip. Moderate joint space narrowing with acetabular subchondral sclerosis and small marginal osteophyte formation. No acute fracture identified in the pelvis. Bones are osteopenic.  IMPRESSION: Chronic changes with no acute osseous abnormality identified.  Electronically Signed   By: Catarina Pouch M.D.   On: 04/15/2021 16:45 DG Shoulder Right CLINICAL DATA:  Right shoulder pain  EXAM: RIGHT SHOULDER - 2+ VIEW  COMPARISON:  None.  FINDINGS: No acute fracture or dislocation identified. Mild joint space narrowing. No suspicious bony lesions identified.  IMPRESSION: No acute osseous abnormality identified.  Electronically Signed   By: Catarina Pouch M.D.   On: 04/15/2021 16:44 Note: {Blank single:19197::No new results found.,No results found under the [facility] electronic medical record.,Imaging results reviewed and explained to patient in Campbell's terms.,Results of ordered imaging test(s) reviewed and explained to patient in Campbell's terms.,Imaging results reviewed.,Reviewed} {Blank single:19197::Results visible under [facility]Results visible under [facility]Results visible under [facility].,Results visible under [facility].,Results visible under Care Everywhere.,Results made available to patient.,Copy of results provided to patient.,Patient noncompliant with diagnostic imaging  orders.,     }  Physical Exam  Vitals: There were no vitals taken for this visit. BMI: Estimated body mass index is 18.78 kg/m as calculated from the following:   Height as of 01/25/24: 5' 2 (1.575 m).   Weight as of 01/25/24: 102 lb 11.2 oz (46.6 kg). Ideal: Patient weight not recorded General appearance: {general exam:210120802::Well nourished, well developed, and well hydrated. In no apparent acute distress} Mental status: {Blank single:19197::Alert and oriented x 3. Exaggerated physical and/or psychosocial pain behavior perceived.,Alert, oriented x 3 (person, place, & time)} {Blank single:19197::Ms. Bray's speech pattern and demeanor seems to suggest oversedation,     } Respiratory: {Blank single:19197::Oxygen-dependent COPD,No evidence of acute respiratory distress} Eyes: {Blank single:19197::Miotic (pupilary constriction) due to opiate use,Midriatic,Anisocoric,Evidence of ptosis,Pin-point pupils,PERLA}   Assessment   Diagnosis Status  No diagnosis found. {Blank single:19197::Absent,Deteriorating,Having a Flare-up,Improved,Improving,Not improving,Not responding,Persistent,Present,Recurring,Reoccurring,Resolved,Responding,Stable,Unchanged,improved,Worsened,Worsening,Controlled} {Blank single:19197::Absent,Deteriorating,Having a Flare-up,Improved,Improving,Not improving,Not responding,Persistent,Present,Recurring,Reoccurring,Resolved,Responding,Stable,Unchanged,improved,Worsened,Worsening,Controlled} {Blank single:19197::Absent,Deteriorating,Having a Flare-up,Improved,Improving,Not improving,Not responding,Persistent,Present,Recurring,Reoccurring,Resolved,Responding,Stable,Unchanged,improved,Worsened,Worsening,Controlled}   Updated Problems: Problem  Herpes Zoster Without Complication  Prediabetes    Plan of Care  Problem-specific:   Assessment and Plan            Ms. NUVIA HILEMAN has a current medication list which includes the following long-term medication(s): atorvastatin, gabapentin, metoprolol succinate, and pantoprazole.  Pharmacotherapy (Medications Ordered): No orders of the defined types were placed in this encounter.  Orders:  No orders of the defined types were placed in this encounter.    {There is no content from the last Plan section.}   No follow-ups on file.    Recent Visits Date Type Provider Dept  01/25/24 Office Visit Brevin Mcfadden K, NP [facility]  Showing recent visits within past 90 days and meeting all other requirements Future Appointments Date Type Provider Dept  04/17/24 Appointment Zakee Deerman K, NP [facility]  Showing future appointments within next 90 days and meeting all other requirements  I discussed the assessment and treatment plan with the patient. The patient was provided an opportunity to ask questions and all were answered. The patient agreed with the plan and demonstrated an understanding of  the instructions.  Patient advised to call back or seek an in-person evaluation if the symptoms or condition worsens.  Duration of encounter: *** minutes.  Total time on encounter, as per AMA guidelines included both the face-to-face and non-face-to-face time personally spent by the physician and/or other qualified health care professional(s) on the day of the encounter (includes time in activities that require the physician or other qualified health care professional and does not include time in activities normally performed by clinical staff). Physician's time may include the following activities when performed: Preparing to see the patient (e.g., pre-charting review of records, searching for previously ordered imaging, lab work, and nerve conduction tests) Review of prior analgesic pharmacotherapies. Reviewing PMP Interpreting ordered tests (e.g., lab  work, imaging, nerve conduction tests) Performing post-procedure evaluations, including interpretation of diagnostic procedures Obtaining and/or reviewing separately obtained history Performing a medically appropriate examination and/or evaluation Counseling and educating the patient/family/caregiver Ordering medications, tests, or procedures Referring and communicating with other health care professionals (when not separately reported) Documenting clinical information in the electronic or other health record Independently interpreting results (not separately reported) and communicating results to the patient/ family/caregiver Care coordination (not separately reported)  Note by: Lateya Dauria K Landynn Dupler, NP (TTS and AI technology used. I apologize for any typographical errors that were not detected and corrected.) Date: 04/17/2024; Time: 4:09 PM

## 2024-04-17 ENCOUNTER — Ambulatory Visit: Attending: Nurse Practitioner | Admitting: Nurse Practitioner

## 2024-04-17 ENCOUNTER — Encounter: Payer: Self-pay | Admitting: Nurse Practitioner

## 2024-04-17 VITALS — BP 140/61 | HR 56 | Temp 98.1°F | Resp 16 | Ht 61.0 in | Wt 102.0 lb

## 2024-04-17 DIAGNOSIS — M4317 Spondylolisthesis, lumbosacral region: Secondary | ICD-10-CM | POA: Insufficient documentation

## 2024-04-17 DIAGNOSIS — M48061 Spinal stenosis, lumbar region without neurogenic claudication: Secondary | ICD-10-CM | POA: Diagnosis present

## 2024-04-17 DIAGNOSIS — G8929 Other chronic pain: Secondary | ICD-10-CM | POA: Insufficient documentation

## 2024-04-17 DIAGNOSIS — G894 Chronic pain syndrome: Secondary | ICD-10-CM | POA: Insufficient documentation

## 2024-04-17 DIAGNOSIS — M545 Low back pain, unspecified: Secondary | ICD-10-CM | POA: Diagnosis present

## 2024-04-17 DIAGNOSIS — Z79899 Other long term (current) drug therapy: Secondary | ICD-10-CM | POA: Insufficient documentation

## 2024-04-17 DIAGNOSIS — Z0289 Encounter for other administrative examinations: Secondary | ICD-10-CM | POA: Insufficient documentation

## 2024-04-17 DIAGNOSIS — M4726 Other spondylosis with radiculopathy, lumbar region: Secondary | ICD-10-CM | POA: Insufficient documentation

## 2024-04-17 DIAGNOSIS — M47817 Spondylosis without myelopathy or radiculopathy, lumbosacral region: Secondary | ICD-10-CM | POA: Diagnosis present

## 2024-04-17 MED ORDER — HYDROCODONE-ACETAMINOPHEN 5-325 MG PO TABS: 1.0000 | ORAL_TABLET | Freq: Four times a day (QID) | ORAL | 0 refills | Status: AC | PRN

## 2024-04-17 NOTE — Progress Notes (Signed)
 Nursing Pain Medication Assessment:  Safety precautions to be maintained throughout the outpatient stay will include: orient to surroundings, keep bed in low position, maintain call bell within reach at all times, provide assistance with transfer out of bed and ambulation.  Medication Inspection Compliance: Pill count conducted under aseptic conditions, in front of the patient. Neither the pills nor the bottle was removed from the patient's sight at any time. Once count was completed pills were immediately returned to the patient in their original bottle.  Medication: Hydrocodone /APAP Pill/Patch Count: 65 pills out of 120 Pill/Patch Appearance: Markings consistent with prescribed medication Bottle Appearance: Standard pharmacy container. Clearly labeled. Filled Date: 18 / 12 / 2025 Last Medication intake:  Today

## 2024-04-17 NOTE — Patient Instructions (Signed)

## 2024-07-12 ENCOUNTER — Encounter: Admitting: Nurse Practitioner
